# Patient Record
Sex: Female | Born: 1955
Health system: Southern US, Community
[De-identification: ages and names within clinical notes are randomized; demographics above are authoritative.]

## PROBLEM LIST (undated history)

## (undated) DIAGNOSIS — K529 Noninfective gastroenteritis and colitis, unspecified: Secondary | ICD-10-CM

## (undated) DIAGNOSIS — Z8669 Personal history of other diseases of the nervous system and sense organs: Secondary | ICD-10-CM

## (undated) DIAGNOSIS — J449 Chronic obstructive pulmonary disease, unspecified: Secondary | ICD-10-CM

## (undated) DIAGNOSIS — M8580 Other specified disorders of bone density and structure, unspecified site: Secondary | ICD-10-CM

## (undated) DIAGNOSIS — L680 Hirsutism: Secondary | ICD-10-CM

## (undated) DIAGNOSIS — F419 Anxiety disorder, unspecified: Secondary | ICD-10-CM

## (undated) DIAGNOSIS — Z8742 Personal history of other diseases of the female genital tract: Secondary | ICD-10-CM

## (undated) DIAGNOSIS — S42302A Unspecified fracture of shaft of humerus, left arm, initial encounter for closed fracture: Secondary | ICD-10-CM

## (undated) DIAGNOSIS — S82899A Other fracture of unspecified lower leg, initial encounter for closed fracture: Secondary | ICD-10-CM

## (undated) DIAGNOSIS — IMO0002 Reserved for concepts with insufficient information to code with codable children: Secondary | ICD-10-CM

## (undated) HISTORY — DX: Hirsutism: L68.0

## (undated) HISTORY — DX: Chronic obstructive pulmonary disease, unspecified: J44.9

## (undated) HISTORY — DX: Noninfective gastroenteritis and colitis, unspecified: K52.9

## (undated) HISTORY — DX: Reserved for concepts with insufficient information to code with codable children: IMO0002

## (undated) HISTORY — DX: Personal history of other diseases of the female genital tract: Z87.42

## (undated) HISTORY — PX: APPENDECTOMY: SHX54

## (undated) HISTORY — DX: Other specified disorders of bone density and structure, unspecified site: M85.80

## (undated) HISTORY — DX: Anxiety disorder, unspecified: F41.9

## (undated) HISTORY — DX: Other fracture of unspecified lower leg, initial encounter for closed fracture: S82.899A

## (undated) HISTORY — DX: Unspecified fracture of shaft of humerus, left arm, initial encounter for closed fracture: S42.302A

## (undated) HISTORY — DX: Personal history of other diseases of the nervous system and sense organs: Z86.69

---

## 1999-08-24 ENCOUNTER — Other Ambulatory Visit: Admission: RE | Admit: 1999-08-24 | Discharge: 1999-08-24 | Payer: Self-pay | Admitting: *Deleted

## 2000-08-25 ENCOUNTER — Other Ambulatory Visit: Admission: RE | Admit: 2000-08-25 | Discharge: 2000-08-25 | Payer: Self-pay | Admitting: *Deleted

## 2001-08-28 ENCOUNTER — Other Ambulatory Visit: Admission: RE | Admit: 2001-08-28 | Discharge: 2001-08-28 | Payer: Self-pay | Admitting: *Deleted

## 2002-09-03 ENCOUNTER — Other Ambulatory Visit: Admission: RE | Admit: 2002-09-03 | Discharge: 2002-09-03 | Payer: Self-pay | Admitting: *Deleted

## 2002-12-02 HISTORY — PX: PARTIAL COLECTOMY: SHX5273

## 2003-05-04 DIAGNOSIS — R87619 Unspecified abnormal cytological findings in specimens from cervix uteri: Secondary | ICD-10-CM

## 2003-05-04 DIAGNOSIS — IMO0002 Reserved for concepts with insufficient information to code with codable children: Secondary | ICD-10-CM

## 2003-05-04 HISTORY — DX: Reserved for concepts with insufficient information to code with codable children: IMO0002

## 2003-05-04 HISTORY — DX: Unspecified abnormal cytological findings in specimens from cervix uteri: R87.619

## 2003-06-14 ENCOUNTER — Encounter: Admission: RE | Admit: 2003-06-14 | Discharge: 2003-06-14 | Payer: Self-pay | Admitting: *Deleted

## 2003-12-02 ENCOUNTER — Other Ambulatory Visit: Admission: RE | Admit: 2003-12-02 | Discharge: 2003-12-02 | Payer: Self-pay | Admitting: *Deleted

## 2004-06-09 ENCOUNTER — Other Ambulatory Visit: Admission: RE | Admit: 2004-06-09 | Discharge: 2004-06-09 | Payer: Self-pay | Admitting: *Deleted

## 2004-12-02 ENCOUNTER — Other Ambulatory Visit: Admission: RE | Admit: 2004-12-02 | Discharge: 2004-12-02 | Payer: Self-pay | Admitting: *Deleted

## 2006-02-24 ENCOUNTER — Other Ambulatory Visit: Admission: RE | Admit: 2006-02-24 | Discharge: 2006-02-24 | Payer: Self-pay | Admitting: Obstetrics & Gynecology

## 2006-08-31 ENCOUNTER — Encounter: Admission: RE | Admit: 2006-08-31 | Discharge: 2006-08-31 | Payer: Self-pay | Admitting: Obstetrics & Gynecology

## 2007-03-23 ENCOUNTER — Other Ambulatory Visit: Admission: RE | Admit: 2007-03-23 | Discharge: 2007-03-23 | Payer: Self-pay | Admitting: Obstetrics & Gynecology

## 2008-03-22 ENCOUNTER — Encounter: Admission: RE | Admit: 2008-03-22 | Discharge: 2008-03-22 | Payer: Self-pay | Admitting: Obstetrics & Gynecology

## 2008-04-05 ENCOUNTER — Other Ambulatory Visit: Admission: RE | Admit: 2008-04-05 | Discharge: 2008-04-05 | Payer: Self-pay | Admitting: Obstetrics & Gynecology

## 2009-09-17 ENCOUNTER — Encounter: Admission: RE | Admit: 2009-09-17 | Discharge: 2009-10-24 | Payer: Self-pay | Admitting: Orthopedic Surgery

## 2009-12-09 ENCOUNTER — Encounter: Admission: RE | Admit: 2009-12-09 | Discharge: 2009-12-09 | Payer: Self-pay | Admitting: Orthopedic Surgery

## 2010-06-09 ENCOUNTER — Other Ambulatory Visit: Payer: Self-pay | Admitting: Obstetrics & Gynecology

## 2010-06-09 DIAGNOSIS — Z1231 Encounter for screening mammogram for malignant neoplasm of breast: Secondary | ICD-10-CM

## 2010-06-23 ENCOUNTER — Ambulatory Visit
Admission: RE | Admit: 2010-06-23 | Discharge: 2010-06-23 | Disposition: A | Discharge status: Home / Self Care | Payer: Self-pay | Source: Ambulatory Visit | Attending: Obstetrics & Gynecology | Admitting: Obstetrics & Gynecology

## 2010-06-23 DIAGNOSIS — Z1231 Encounter for screening mammogram for malignant neoplasm of breast: Secondary | ICD-10-CM

## 2011-09-23 ENCOUNTER — Other Ambulatory Visit: Payer: Self-pay | Admitting: Obstetrics & Gynecology

## 2011-09-23 DIAGNOSIS — Z1231 Encounter for screening mammogram for malignant neoplasm of breast: Secondary | ICD-10-CM

## 2011-10-12 ENCOUNTER — Ambulatory Visit
Admission: RE | Admit: 2011-10-12 | Discharge: 2011-10-12 | Disposition: A | Discharge status: Home / Self Care | Payer: 59 | Source: Ambulatory Visit | Attending: Obstetrics & Gynecology | Admitting: Obstetrics & Gynecology

## 2011-10-12 DIAGNOSIS — Z1231 Encounter for screening mammogram for malignant neoplasm of breast: Secondary | ICD-10-CM

## 2012-06-12 ENCOUNTER — Encounter: Payer: Self-pay | Admitting: Obstetrics & Gynecology

## 2012-07-13 ENCOUNTER — Other Ambulatory Visit: Payer: Self-pay | Admitting: Obstetrics & Gynecology

## 2012-07-13 ENCOUNTER — Other Ambulatory Visit: Payer: Self-pay

## 2012-07-13 DIAGNOSIS — Z1231 Encounter for screening mammogram for malignant neoplasm of breast: Secondary | ICD-10-CM

## 2012-07-19 ENCOUNTER — Telehealth: Payer: Self-pay | Admitting: Obstetrics & Gynecology

## 2012-07-19 NOTE — Telephone Encounter (Signed)
Pt only would like to see Dr Hyacinth Meeker for her aex...pls call pt to rs soon. thanks

## 2012-07-26 ENCOUNTER — Encounter: Payer: Self-pay | Admitting: Obstetrics & Gynecology

## 2012-07-26 ENCOUNTER — Ambulatory Visit: Payer: Self-pay | Admitting: Obstetrics & Gynecology

## 2012-07-26 ENCOUNTER — Ambulatory Visit (INDEPENDENT_AMBULATORY_CARE_PROVIDER_SITE_OTHER): Payer: 59 | Admitting: Obstetrics & Gynecology

## 2012-07-26 VITALS — BP 112/72 | Ht 64.25 in | Wt 112.0 lb

## 2012-07-26 DIAGNOSIS — L680 Hirsutism: Secondary | ICD-10-CM

## 2012-07-26 DIAGNOSIS — Z0000 Encounter for general adult medical examination without abnormal findings: Secondary | ICD-10-CM

## 2012-07-26 DIAGNOSIS — N289 Disorder of kidney and ureter, unspecified: Secondary | ICD-10-CM

## 2012-07-26 DIAGNOSIS — L689 Hypertrichosis, unspecified: Secondary | ICD-10-CM

## 2012-07-26 DIAGNOSIS — Z01419 Encounter for gynecological examination (general) (routine) without abnormal findings: Secondary | ICD-10-CM

## 2012-07-26 LAB — POCT URINALYSIS DIPSTICK
Bilirubin, UA: NEGATIVE
Glucose, UA: NEGATIVE
Leukocytes, UA: NEGATIVE
Nitrite, UA: NEGATIVE
Urobilinogen, UA: NEGATIVE
pH, UA: 5

## 2012-07-26 MED ORDER — EFLORNITHINE HCL 13.9 % EX CREA: 1.0000 "application " | TOPICAL_CREAM | Freq: Two times a day (BID) | CUTANEOUS | Status: DC

## 2012-07-26 NOTE — Patient Instructions (Signed)

## 2012-07-26 NOTE — Progress Notes (Signed)
57 y.o. G0P0 MarriedCaucasianF here for annual exam.  She had full physical exam with [facility].  Labs were all fine except GFR was 57.  Off Spironolactone and using Vaniqa.  Complaint of small red patch on abdomen.  Just moved mother in [place] into nursing home.  Was doing lots of sleeping on couches and going through clothes/closets.  No itching.  No LMP recorded. Patient is postmenopausal.          Sexually active: yes  The current method of family planning is post menopausal status.    Exercising: yes  walking and ice skatine Smoker:  no  Health Maintenance: Pap:  06/30/11 WNL/negative HR HPV MMG:  10/13/11 normal  Colonoscopy:  10/09 repeat 5 years--this year (mother had colon cancer) BMD:   12/09/09 -1.6 spine TDaP:  7/09   reports that she has never smoked. She does not have any smokeless tobacco history on file. She reports that  drinks alcohol. She reports that she does not use illicit drugs.  Past Medical History  Diagnosis Date  . Abnormal pap 2005    CIN 1- Colpo CIN1, Neg ECC    Past Surgical History  Procedure Laterality Date  . Partial colectomy Right 12/2002    with appendectomy    Current Outpatient Prescriptions  Medication Sig Dispense Refill  . aspirin 81 MG tablet 81 mg. Take two daily      . B Complex Vitamins (B COMPLEX PO) Take by mouth.      . Calcium Carb-Cholecalciferol (CALCIUM 1000 + D PO) Take by mouth.      . FIBER PO Take by mouth 2 (two) times daily.      Marland Kitchen L-Lysine 500 MG CAPS Take 1 capsule by mouth.        No current facility-administered medications for this visit.    Family History  Problem Relation Age of Onset  . Cancer - Ovarian Mother 74  . Cancer - Colon Mother 61    ROS:  Pertinent items are noted in HPI.  Otherwise, a comprehensive ROS was negative.  Exam:   BP 112/72  Ht 5' 4.25" (1.632 m)  Wt 112 lb (50.803 kg)  BMI 19.07 kg/m2  Height:   Height: 5' 4.25" (163.2 cm)  Ht Readings from Last 3 Encounters:  07/26/12 5' 4.25"  (1.632 m)    General appearance: alert, cooperative and appears stated age Head: Normocephalic, without obvious abnormality, atraumatic Neck: no adenopathy, supple, symmetrical, trachea midline and thyroid not enlarged, symmetric, no tenderness/mass/nodules Lungs: clear to auscultation bilaterally Breasts: Inspection negative, No nipple retraction or dimpling, No nipple discharge or bleeding, No axillary or supraclavicular adenopathy, Normal to palpation without dominant masses Heart: regular rate and rhythm Abdomen: soft, non-tender; bowel sounds normal; no masses,  no organomegaly Extremities: extremities normal, atraumatic, no cyanosis or edema Skin: Skin color, texture, turgor normal. No rashes.  5 x 2cm erythematous patch-not raised noted just inferiorly from umbilicus Lymph nodes: Cervical, supraclavicular, and axillary nodes normal. No abnormal inguinal nodes palpated Neurologic: Grossly normal   Pelvic: External genitalia:  no lesions              Urethra:  normal appearing urethra with no masses, tenderness or lesions              Bartholins and Skenes: normal                 Vagina: atrohpic appearing vagina with normal color and discharge, no lesions  Cervix: no lesions              Pap taken: yes Bimanual Exam:  Uterus:  normal size, contour, position, consistency, mobility, non-tender              Adnexa: normal adnexa               Rectovaginal: Confirms               Anus:  normal sphincter tone, no lesions  A:  Well Woman with normal exam, mild renal insufficiency possibly from spirinolactone use, vaginal atrophy, new erythematous patch on abdomen  P:   mammogram pap smear Return 3 months for BMP with GFR Pt will watch skin lesion--if worsens will call for derm referal return annually or prn  An After Visit Summary was printed and given to the patient.

## 2012-09-26 ENCOUNTER — Other Ambulatory Visit (INDEPENDENT_AMBULATORY_CARE_PROVIDER_SITE_OTHER): Payer: 59

## 2012-09-26 DIAGNOSIS — N289 Disorder of kidney and ureter, unspecified: Secondary | ICD-10-CM

## 2012-09-26 LAB — BASIC METABOLIC PANEL WITH GFR
BUN: 12 mg/dL (ref 6–23)
CO2: 28 mEq/L (ref 19–32)
Calcium: 9.5 mg/dL (ref 8.4–10.5)
Creat: 0.81 mg/dL (ref 0.50–1.10)
GFR, Est African American: >89 mL/min
Glucose, Bld: 88 mg/dL (ref 70–99)
Sodium: 140 mEq/L (ref 135–145)

## 2012-09-28 ENCOUNTER — Telehealth: Payer: Self-pay

## 2012-09-28 NOTE — Telephone Encounter (Signed)
Message copied by Elisha Headland on Thu Sep 28, 2012  5:05 PM ------      Message from: Jerene Bears      Created: Wed Sep 27, 2012  7:16 AM       Please inform patient BMP was normal.  GFR was normal as well.  She had an abnormal value earlier in the year with PCP. ------

## 2012-09-28 NOTE — Telephone Encounter (Signed)
5/29 lmtcb//kn

## 2012-09-29 NOTE — Telephone Encounter (Signed)
Patient notified of results.

## 2012-10-12 ENCOUNTER — Ambulatory Visit
Admission: RE | Admit: 2012-10-12 | Discharge: 2012-10-12 | Disposition: A | Discharge status: Home / Self Care | Payer: 59 | Source: Ambulatory Visit

## 2012-10-12 DIAGNOSIS — Z1231 Encounter for screening mammogram for malignant neoplasm of breast: Secondary | ICD-10-CM

## 2013-07-18 DIAGNOSIS — E282 Polycystic ovarian syndrome: Secondary | ICD-10-CM | POA: Insufficient documentation

## 2013-07-18 DIAGNOSIS — N399 Disorder of urinary system, unspecified: Secondary | ICD-10-CM | POA: Insufficient documentation

## 2013-07-18 DIAGNOSIS — N289 Disorder of kidney and ureter, unspecified: Secondary | ICD-10-CM | POA: Insufficient documentation

## 2013-07-18 DIAGNOSIS — Q211 Atrial septal defect: Secondary | ICD-10-CM | POA: Insufficient documentation

## 2013-07-18 DIAGNOSIS — Q2111 Secundum atrial septal defect: Secondary | ICD-10-CM | POA: Insufficient documentation

## 2013-07-18 HISTORY — DX: Polycystic ovarian syndrome: E28.2

## 2013-07-19 ENCOUNTER — Other Ambulatory Visit: Payer: Self-pay | Admitting: *Deleted

## 2013-07-19 DIAGNOSIS — L689 Hypertrichosis, unspecified: Secondary | ICD-10-CM

## 2013-07-19 MED ORDER — EFLORNITHINE HCL 13.9 % EX CREA: 1.0000 "application " | TOPICAL_CREAM | Freq: Two times a day (BID) | CUTANEOUS | Status: DC

## 2013-07-19 NOTE — Telephone Encounter (Signed)
Incoming fax requesting Vaniqa cream.  Last AEX and refill 07/26/2012 #30g/12 refills Next appt 10/16/2013   Will refill enough until next appt 10/2013.

## 2013-07-19 NOTE — Addendum Note (Signed)
Addended by: Dion BodyBELTRAN, Ripley Lovecchio C on: 07/19/2013 01:50 PM   Modules accepted: Orders

## 2013-08-01 DIAGNOSIS — J449 Chronic obstructive pulmonary disease, unspecified: Secondary | ICD-10-CM

## 2013-08-01 HISTORY — DX: Chronic obstructive pulmonary disease, unspecified: J44.9

## 2013-09-27 DIAGNOSIS — J449 Chronic obstructive pulmonary disease, unspecified: Secondary | ICD-10-CM | POA: Insufficient documentation

## 2013-10-16 ENCOUNTER — Encounter: Payer: Self-pay | Admitting: Obstetrics & Gynecology

## 2013-10-16 ENCOUNTER — Ambulatory Visit (INDEPENDENT_AMBULATORY_CARE_PROVIDER_SITE_OTHER): Payer: 59 | Admitting: Obstetrics & Gynecology

## 2013-10-16 VITALS — BP 104/68 | HR 60 | Resp 16 | Ht 63.75 in | Wt 116.8 lb

## 2013-10-16 DIAGNOSIS — L680 Hirsutism: Secondary | ICD-10-CM

## 2013-10-16 DIAGNOSIS — L689 Hypertrichosis, unspecified: Secondary | ICD-10-CM

## 2013-10-16 DIAGNOSIS — Z01419 Encounter for gynecological examination (general) (routine) without abnormal findings: Secondary | ICD-10-CM

## 2013-10-16 MED ORDER — EFLORNITHINE HCL 13.9 % EX CREA: 1.0000 "application " | TOPICAL_CREAM | Freq: Two times a day (BID) | CUTANEOUS | Status: DC

## 2013-10-16 NOTE — Progress Notes (Addendum)
58 y.o. G0P0000 MarriedCaucasianF here for annual exam.  Has physical exam three months ago.  Labs including renal function was all fine.  No vaginal bleeding.     Mother died earlier this year.  Had long term dementia.  Has hip and then femur fracture.    Patient's last menstrual period was 05/03/2008.          Sexually active: yes-some The current method of family planning is post menopausal status.    Exercising: yes  skating and walking Smoker:  no  Health Maintenance: Pap:  07/26/12 WNL History of abnormal Pap:  yes MMG:  10/12/12-normal Colonoscopy:  11/14-repeat in 5 years BMD:   12/09/09-mild osteopenia TDaP:  7/09 Screening Labs: PCP, Hb today: PCP, Urine today: PCP   reports that she has never smoked. She has never used smokeless tobacco. She reports that she drinks about .5 ounces of alcohol per week. She reports that she does not use illicit drugs.  Past Medical History  Diagnosis Date  . Abnormal pap 2005    CIN 1- Colpo CIN1, Neg ECC  . History of PCOS   . Hx of migraines   . Anxiety   . Ankle fracture   . Colitis   . Hirsutism   . COPD (chronic obstructive pulmonary disease) 4/15    had spirometry     Past Surgical History  Procedure Laterality Date  . Partial colectomy Right 12/2002    with appendectomy  . Appendectomy      Current Outpatient Prescriptions  Medication Sig Dispense Refill  . aspirin 81 MG tablet 81 mg. Take one daily      . B Complex Vitamins (B COMPLEX PO) Take by mouth.      . Calcium Carb-Cholecalciferol (CALCIUM 1000 + D PO) Take by mouth.      . Eflornithine HCl (VANIQA) 13.9 % cream Apply 1 application topically 2 (two) times daily at 10 AM and 5 PM.  30 g  2  . escitalopram (LEXAPRO) 5 MG tablet 5 mg daily.      Marland Kitchen. FIBER PO Take by mouth 2 (two) times daily.      Marland Kitchen. L-Lysine 500 MG CAPS Take 1 capsule by mouth.        No current facility-administered medications for this visit.    Family History  Problem Relation Age of Onset  .  Cancer - Ovarian Mother 5541  . Cancer - Colon Mother 9083  . Hypertension Father   . Diabetes Paternal Grandfather   . Heart disease Maternal Grandfather     ROS:  Pertinent items are noted in HPI.  Otherwise, a comprehensive ROS was negative.  Exam:   BP 104/68  Pulse 60  Resp 16  Ht 5' 3.75" (1.619 m)  Wt 116 lb 12.8 oz (52.98 kg)  BMI 20.21 kg/m2  LMP 05/03/2008  Weight change:+4#   Height: 5' 3.75" (161.9 cm)  Ht Readings from Last 3 Encounters:  10/16/13 5' 3.75" (1.619 m)  07/26/12 5' 4.25" (1.632 m)    General appearance: alert, cooperative and appears stated age Head: Normocephalic, without obvious abnormality, atraumatic Neck: no adenopathy, supple, symmetrical, trachea midline and thyroid normal to inspection and palpation Lungs: clear to auscultation bilaterally Breasts: normal appearance, no masses or tenderness Heart: regular rate and rhythm Abdomen: soft, non-tender; bowel sounds normal; no masses,  no organomegaly Extremities: extremities normal, atraumatic, no cyanosis or edema Skin: Skin color, texture, turgor normal. No rashes or lesions Lymph nodes: Cervical, supraclavicular, and axillary  nodes normal. No abnormal inguinal nodes palpated Neurologic: Grossly normal   Pelvic: External genitalia:  no lesions              Urethra:  normal appearing urethra with no masses, tenderness or lesions              Bartholins and Skenes: normal                 Vagina: normal appearing vagina with normal color and discharge, no lesions              Cervix: no lesions              Pap taken: no Bimanual Exam:  Uterus:  normal size, contour, position, consistency, mobility, non-tender              Adnexa: normal adnexa and no mass, fullness, tenderness               Rectovaginal: Confirms               Anus:  normal sphincter tone, no lesions  A:  Well Woman with normal exam Vaginal atrophic changes Mild renal insufficiency with spironolactone use.  Off  now. Depression.  Now on Lexapro.   Grief reaction to mother's death.  Coping appropriately.  Vaniqu rx to pharmacy.  P:   Mammogram yearly.  D/W pt 3D due to dense breasts. pap smear last year.  Neg pap with neg HR HPV 2/13. Labs with PCP's office return annually or prn  An After Visit Summary was printed and given to the patient.

## 2013-10-16 NOTE — Addendum Note (Signed)
Addended by: Jerene BearsMILLER, MARY S on: 10/16/2013 02:35 PM   Modules accepted: Orders

## 2013-10-16 NOTE — Patient Instructions (Signed)

## 2013-12-01 DIAGNOSIS — N289 Disorder of kidney and ureter, unspecified: Secondary | ICD-10-CM | POA: Insufficient documentation

## 2013-12-01 DIAGNOSIS — B009 Herpesviral infection, unspecified: Secondary | ICD-10-CM | POA: Insufficient documentation

## 2013-12-01 DIAGNOSIS — G902 Horner's syndrome: Secondary | ICD-10-CM | POA: Insufficient documentation

## 2013-12-01 DIAGNOSIS — Q2112 Patent foramen ovale: Secondary | ICD-10-CM | POA: Insufficient documentation

## 2013-12-01 DIAGNOSIS — Q211 Atrial septal defect: Secondary | ICD-10-CM | POA: Insufficient documentation

## 2013-12-24 ENCOUNTER — Other Ambulatory Visit: Payer: Self-pay

## 2013-12-24 DIAGNOSIS — Z1231 Encounter for screening mammogram for malignant neoplasm of breast: Secondary | ICD-10-CM

## 2014-01-09 ENCOUNTER — Ambulatory Visit
Admission: RE | Admit: 2014-01-09 | Discharge: 2014-01-09 | Disposition: A | Discharge status: Home / Self Care | Payer: 59 | Source: Ambulatory Visit

## 2014-01-09 DIAGNOSIS — Z1231 Encounter for screening mammogram for malignant neoplasm of breast: Secondary | ICD-10-CM

## 2014-10-29 ENCOUNTER — Encounter: Payer: Self-pay | Admitting: Obstetrics & Gynecology

## 2014-10-29 ENCOUNTER — Other Ambulatory Visit: Payer: Self-pay | Admitting: Obstetrics & Gynecology

## 2014-10-29 ENCOUNTER — Ambulatory Visit (INDEPENDENT_AMBULATORY_CARE_PROVIDER_SITE_OTHER): Payer: BLUE CROSS/BLUE SHIELD | Admitting: Obstetrics & Gynecology

## 2014-10-29 VITALS — BP 138/80 | HR 60 | Resp 16 | Ht 64.0 in | Wt 121.0 lb

## 2014-10-29 DIAGNOSIS — Z124 Encounter for screening for malignant neoplasm of cervix: Secondary | ICD-10-CM

## 2014-10-29 DIAGNOSIS — L689 Hypertrichosis, unspecified: Secondary | ICD-10-CM

## 2014-10-29 DIAGNOSIS — Z01419 Encounter for gynecological examination (general) (routine) without abnormal findings: Secondary | ICD-10-CM | POA: Diagnosis not present

## 2014-10-29 NOTE — Telephone Encounter (Signed)
Medication refill request: Vaniqa cream  Last AEX:  10/29/14 SM Next AEX: 01/29/16 SM Last MMG (if hormonal medication request): 01/09/14 BIRADS1:Neg Refill authorized: 10/16/13 #30g/6R. Today please advise.

## 2014-10-29 NOTE — Patient Instructions (Signed)
[facility]  [address], [place], [place] [address], [place]  Phone: [phone] Dr. Gloris ManchesterSteinhelfer  [facility] [address], [place], [place] [address]

## 2014-10-29 NOTE — Telephone Encounter (Signed)
Patient is requesting a refill of Vaniqua. Confirmed pharmacy on file.

## 2014-10-29 NOTE — Progress Notes (Signed)
Patient ID: ANARIA KRONER, female   DOB: 20-Oct-1955, 59 y.o.   MRN: [id number]   59 y.o. G0P0000 MarriedCaucasianF here for annual exam.  Doing well. No vaginal bleeding.  Would like names of dermatologists as she has a mole she would like checked.  No vaginal bleeding.  PCP:  Dr. Derrell Lolling, [facility].  Last labs were done in the last year.    Patient's last menstrual period was 05/03/2008.          Sexually active: No.  The current method of family planning is none.    Exercising: Yes.    ice skating  Smoker:  no  Health Maintenance: Pap:  07-26-12 WNL  History of abnormal Pap:  Yes- patient said it was many years ago and no procedures were done MMG:  01-09-14 WNL Colonoscopy:  03-2013 repeat in 5 years BMD:   12-09-09 Mild osteopenia TDaP:  11-2007 Screening Labs: PCP does labs   reports that she has never smoked. She has never used smokeless tobacco. She reports that she drinks about 2.4 - 3.0 oz of alcohol per week. She reports that she does not use illicit drugs.  Past Medical History  Diagnosis Date  . Abnormal pap 2005    CIN 1- Colpo CIN1, Neg ECC  . History of PCOS   . Hx of migraines   . Anxiety   . Ankle fracture   . Colitis   . Hirsutism   . COPD (chronic obstructive pulmonary disease) 4/15    had spirometry   . Osteopenia     Past Surgical History  Procedure Laterality Date  . Partial colectomy Right 12/2002    with appendectomy  . Appendectomy      Current Outpatient Prescriptions  Medication Sig Dispense Refill  . aspirin 81 MG tablet 81 mg. Take one daily    . Calcium Carb-Cholecalciferol (CALCIUM 1000 + D PO) Take by mouth.    . Eflornithine HCl (VANIQA) 13.9 % cream Apply 1 application topically 2 (two) times daily at 10 AM and 5 PM. 30 g 6  . FIBER PO Take by mouth 2 (two) times daily.     No current facility-administered medications for this visit.    Family History  Problem Relation Age of Onset  . Cancer - Ovarian Mother 20  . Cancer -  Colon Mother 15  . Hypertension Father   . Diabetes Paternal Grandfather   . Heart disease Maternal Grandfather     ROS:  Pertinent items are noted in HPI.  Otherwise, a comprehensive ROS was negative.  Exam:   BP 138/80 mmHg  Pulse 60  Resp 16  Ht  (1.626 m)  Wt 121 lb (54.885 kg)  BMI 20.76 kg/m2  LMP 05/03/2008   Height:  (162.6 cm)  Ht Readings from Last 3 Encounters:  10/29/14  (1.626 m)  10/16/13 5' 3.75" (1.619 m)  07/26/12 5' 4.25" (1.632 m)    General appearance: alert, cooperative and appears stated age Head: Normocephalic, without obvious abnormality, atraumatic Neck: no adenopathy, supple, symmetrical, trachea midline and thyroid normal to inspection and palpation Lungs: clear to auscultation bilaterally Breasts: normal appearance, no masses or tenderness Heart: regular rate and rhythm Abdomen: soft, non-tender; bowel sounds normal; no masses,  no organomegaly Extremities: extremities normal, atraumatic, no cyanosis or edema Skin: Skin color, texture, turgor normal. No rashes or lesions Lymph nodes: Cervical, supraclavicular, and axillary nodes normal. No abnormal inguinal nodes palpated Neurologic: Grossly normal   Pelvic:  External genitalia:  no lesions              Urethra:  normal appearing urethra with no masses, tenderness or lesions              Bartholins and Skenes: normal                 Vagina: normal appearing vagina with normal color and discharge, no lesions              Cervix: no lesions              Pap taken: Yes.   Bimanual Exam:  Uterus:  normal size, contour, position, consistency, mobility, non-tender              Adnexa: normal adnexa and no mass, fullness, tenderness               Rectovaginal: Confirms               Anus:  normal sphincter tone, no lesions  Chaperone was present for exam.  A:  Well Woman with normal exax Mild renal insufficiency possibly from spirinolactone use Vaginal atrophy Family hx of colon  cancer/adenomatous polyps (mother and sister)  P: Mammogram yearly pap smear obtained today Names for dermatologist given Labs with PCP Colonoscopy recommended for every five years--2019 will be the next one. return annually or prn

## 2014-10-30 MED ORDER — EFLORNITHINE HCL 13.9 % EX CREA: 1.0000 "application " | TOPICAL_CREAM | Freq: Two times a day (BID) | CUTANEOUS | Status: DC

## 2014-10-31 LAB — IPS PAP TEST WITH REFLEX TO HPV

## 2016-01-29 ENCOUNTER — Ambulatory Visit (INDEPENDENT_AMBULATORY_CARE_PROVIDER_SITE_OTHER): Payer: BLUE CROSS/BLUE SHIELD | Admitting: Obstetrics & Gynecology

## 2016-01-29 ENCOUNTER — Encounter: Payer: Self-pay | Admitting: Obstetrics & Gynecology

## 2016-01-29 VITALS — BP 120/80 | HR 70 | Resp 16 | Ht 63.75 in | Wt 119.0 lb

## 2016-01-29 DIAGNOSIS — Z01419 Encounter for gynecological examination (general) (routine) without abnormal findings: Secondary | ICD-10-CM

## 2016-01-29 DIAGNOSIS — Z124 Encounter for screening for malignant neoplasm of cervix: Secondary | ICD-10-CM | POA: Diagnosis not present

## 2016-01-29 DIAGNOSIS — Z0000 Encounter for general adult medical examination without abnormal findings: Secondary | ICD-10-CM | POA: Diagnosis not present

## 2016-01-29 DIAGNOSIS — N39498 Other specified urinary incontinence: Secondary | ICD-10-CM | POA: Diagnosis not present

## 2016-01-29 DIAGNOSIS — L689 Hypertrichosis, unspecified: Secondary | ICD-10-CM

## 2016-01-29 LAB — POCT URINALYSIS DIPSTICK
Bilirubin, UA: NEGATIVE
GLUCOSE UA: NEGATIVE
Ketones, UA: NEGATIVE
Leukocytes, UA: NEGATIVE
NITRITE UA: NEGATIVE
PROTEIN UA: NEGATIVE
UROBILINOGEN UA: NEGATIVE
pH, UA: 5

## 2016-01-29 LAB — COMPREHENSIVE METABOLIC PANEL
ALK PHOS: 80 U/L (ref 33–130)
ALT: 18 U/L (ref 6–29)
AST: 22 U/L (ref 10–35)
Albumin: 4.5 g/dL (ref 3.6–5.1)
BILIRUBIN TOTAL: 0.8 mg/dL (ref 0.2–1.2)
BUN: 16 mg/dL (ref 7–25)
CO2: 27 mmol/L (ref 20–31)
CREATININE: 0.8 mg/dL (ref 0.50–1.05)
Calcium: 9.8 mg/dL (ref 8.6–10.4)
Chloride: 103 mmol/L (ref 98–110)
GLUCOSE: 80 mg/dL (ref 65–99)
Potassium: 4.4 mmol/L (ref 3.5–5.3)
SODIUM: 142 mmol/L (ref 135–146)
Total Protein: 7.1 g/dL (ref 6.1–8.1)

## 2016-01-29 LAB — CBC
HCT: 42.7 % (ref 35.0–45.0)
HEMOGLOBIN: 14.5 g/dL (ref 11.7–15.5)
MCH: 30.9 pg (ref 27.0–33.0)
MCHC: 34 g/dL (ref 32.0–36.0)
MCV: 90.9 fL (ref 80.0–100.0)
MPV: 9 fL (ref 7.5–12.5)
Platelets: 320 10*3/uL (ref 140–400)
RBC: 4.7 MIL/uL (ref 3.80–5.10)
RDW: 13 % (ref 11.0–15.0)
WBC: 7.3 10*3/uL (ref 3.8–10.8)

## 2016-01-29 LAB — TSH: TSH: 1.67 m[IU]/L

## 2016-01-29 LAB — LIPID PANEL
Cholesterol: 192 mg/dL (ref 125–200)
HDL: 67 mg/dL (ref >=46)
LDL CALC: 108 mg/dL (ref <130)
Total CHOL/HDL Ratio: 2.9 Ratio (ref <=5.0)
Triglycerides: 83 mg/dL (ref <150)
VLDL: 17 mg/dL (ref <30)

## 2016-01-29 MED ORDER — EFLORNITHINE HCL 13.9 % EX CREA: 1.0000 "application " | TOPICAL_CREAM | Freq: Two times a day (BID) | CUTANEOUS | 12 refills | Status: DC

## 2016-01-29 NOTE — Progress Notes (Signed)
60 y.o. G0P0000 MarriedCaucasianF here for annual exam.  Doing well.  No vaginal bleeding.  Had colon blockage last year and was hospitalized for this.  Relived with bowel rest.    She's had three episodes of urine leakage that have occurred without any prior symptoms--one exercising, one while walking into her bedroom, and one at night.  The last one woke her due to being wet.  She reports no symptoms of stress in continence.    PCP:  Mary FlashLucy Barden, PA, in [facility]illo's office  Patient's last menstrual period was 05/03/2008.          Sexually active: Yes.    The current method of family planning is post menopausal status.    Exercising: Yes.    work out, figure skating Smoker:  no  Health Maintenance: Pap:  10/29/14 Neg, 2/13 neg HR HPV History of abnormal Pap:  yes MMG:  01/09/14 BIRADS1:neg.  Aware it is Colonoscopy:  7/16 with endoscopy during hospitalization July 2016.  BMD:   12/11/09 Osteopenia TDaP:  2009 Pneumonia vaccine(s):  Done with PCP Zostavax:   No Hep C testing: done: neg.  Pt cannot remember where but knows she's had this done Screening Labs: here, Hb today: 13.7, Urine today: RBC=Small    reports that she has never smoked. She has never used smokeless tobacco. She reports that she drinks about 2.4 - 3.0 oz of alcohol per week . She reports that she does not use drugs.   Past Medical History:  Diagnosis Date  . Abnormal pap 2005   CIN 1- Colpo CIN1, Neg ECC  . Ankle fracture   . Anxiety   . Colitis   . COPD (chronic obstructive pulmonary disease) (HCC) 4/15   had spirometry   . Hirsutism   . History of PCOS   . Hx of migraines   . Osteopenia     Past Surgical History:  Procedure Laterality Date  . APPENDECTOMY    . PARTIAL COLECTOMY Right 12/2002   with appendectomy    Current Outpatient Prescriptions  Medication Sig Dispense Refill  . aspirin 81 MG tablet 81 mg. Take one daily    . B Complex Vitamins (VITAMIN-B COMPLEX) TABS Take by mouth.    . Calcium  Carb-Cholecalciferol (CALCIUM 1000 + D PO) Take by mouth.    . Eflornithine HCl (VANIQA) 13.9 % cream Apply 1 application topically 2 (two) times daily at 10 AM and 5 PM. 30 g 6  . FIBER PO Take by mouth 2 (two) times daily.    Marland Kitchen. L-Lysine 500 MG TABS Take by mouth.    . Multiple Vitamins-Minerals (HAIR/SKIN/NAILS/BIOTIN) TABS Take by mouth daily.     No current facility-administered medications for this visit.     Family History  Problem Relation Age of Onset  . Cancer - Ovarian Mother 5841  . Cancer - Colon Mother 7983  . Hypertension Father   . Diabetes Paternal Grandfather   . Heart disease Maternal Grandfather     ROS:  Pertinent items are noted in HPI.  Otherwise, a comprehensive ROS was negative.  Exam:   BP 120/80 (BP Location: Right Arm, Patient Position: Sitting, Cuff Size: Normal)   Pulse 70   Resp 16   Ht 5' 3.75" (1.619 m)   Wt 119 lb (54 kg)   LMP 05/03/2008   BMI 20.59 kg/m   Weight change: -2#  Height: 5' 3.75" (161.9 cm)  Ht Readings from Last 3 Encounters:  01/29/16 5' 3.75" (1.619  m)  10/29/14 5\' 4"  (1.626 m)  10/16/13 5' 3.75" (1.619 m)    General appearance: alert, cooperative and appears stated age Head: Normocephalic, without obvious abnormality, atraumatic Neck: no adenopathy, supple, symmetrical, trachea midline and thyroid normal to inspection and palpation Lungs: clear to auscultation bilaterally Breasts: normal appearance, no masses or tenderness Heart: regular rate and rhythm Abdomen: soft, non-tender; bowel sounds normal; no masses,  no organomegaly Extremities: extremities normal, atraumatic, no cyanosis or edema Skin: Skin color, texture, turgor normal. No rashes or lesions Lymph nodes: Cervical, supraclavicular, and axillary nodes normal. No abnormal inguinal nodes palpated Neurologic: Grossly normal  Pelvic: External genitalia:  no lesions              Urethra:  normal appearing urethra with no masses, tenderness or lesions               Bartholins and Skenes: normal                 Vagina: normal appearing vagina with normal color and discharge, no lesions, atrophic changes               Cervix: no lesions              Pap taken: Yes.   Bimanual Exam:  Uterus:  normal size, contour, position, consistency, mobility, non-tender              Adnexa: normal adnexa and no mass, fullness, tenderness               Rectovaginal: Confirms               Anus:  normal sphincter tone, no lesions  Chaperone was present for exam.  A:  Well Woman with normal exax Mild renal insufficiency possibly from spirinolactone use Vaginal atrophy Family hx of colon cancer/adenomatous polyps (mother and sister).  Colonoscopy 7/16 done after blockage. Three episodes of spontaneous urinary incontinence  P: Mammogram yearly pap smear obtained today CBC, CMP, Lipids, TSH, Vit D, Colonoscopy recommended for every five years.  Last one July, 2017. Recommended urology referral.  Pt would like to monitor this further before going for a referral. Urine culture. return annually or prn

## 2016-01-30 LAB — URINE CULTURE: Organism ID, Bacteria: NO GROWTH

## 2016-01-30 LAB — VITAMIN D 25 HYDROXY (VIT D DEFICIENCY, FRACTURES): Vit D, 25-Hydroxy: 41 ng/mL (ref 30–100)

## 2016-02-02 LAB — IPS PAP TEST WITH HPV

## 2016-02-03 LAB — HEMOGLOBIN, FINGERSTICK: HEMOGLOBIN, FINGERSTICK: 13.7 g/dL (ref 12.0–16.0)

## 2017-02-12 ENCOUNTER — Other Ambulatory Visit: Payer: Self-pay | Admitting: Obstetrics & Gynecology

## 2017-02-12 DIAGNOSIS — L689 Hypertrichosis, unspecified: Secondary | ICD-10-CM

## 2017-02-14 NOTE — Telephone Encounter (Signed)
Medication refill request: Vaniqa Last AEX:  01-29-16  Next AEX: 05-13-17  Last MMG (if hormonal medication request): 01-09-14 WNL  Refill authorized: please advise

## 2017-04-02 HISTORY — PX: CATARACT EXTRACTION, BILATERAL: SHX1313

## 2017-05-03 DIAGNOSIS — S42302A Unspecified fracture of shaft of humerus, left arm, initial encounter for closed fracture: Secondary | ICD-10-CM

## 2017-05-03 HISTORY — DX: Unspecified fracture of shaft of humerus, left arm, initial encounter for closed fracture: S42.302A

## 2017-05-13 ENCOUNTER — Other Ambulatory Visit: Payer: Self-pay

## 2017-05-13 ENCOUNTER — Other Ambulatory Visit (HOSPITAL_COMMUNITY)
Admission: RE | Admit: 2017-05-13 | Discharge: 2017-05-13 | Disposition: A | Discharge status: Home / Self Care | Payer: Commercial Managed Care - PPO | Source: Ambulatory Visit | Attending: Obstetrics & Gynecology | Admitting: Obstetrics & Gynecology

## 2017-05-13 ENCOUNTER — Encounter: Payer: Self-pay | Admitting: Obstetrics & Gynecology

## 2017-05-13 ENCOUNTER — Other Ambulatory Visit: Payer: Self-pay | Admitting: Obstetrics & Gynecology

## 2017-05-13 ENCOUNTER — Ambulatory Visit (INDEPENDENT_AMBULATORY_CARE_PROVIDER_SITE_OTHER): Payer: Commercial Managed Care - PPO | Admitting: Obstetrics & Gynecology

## 2017-05-13 ENCOUNTER — Ambulatory Visit
Admission: RE | Admit: 2017-05-13 | Discharge: 2017-05-13 | Disposition: A | Discharge status: Home / Self Care | Payer: Commercial Managed Care - PPO | Source: Ambulatory Visit | Attending: Obstetrics & Gynecology | Admitting: Obstetrics & Gynecology

## 2017-05-13 VITALS — BP 120/60 | HR 68 | Resp 16 | Ht 63.75 in | Wt 114.0 lb

## 2017-05-13 DIAGNOSIS — Z1231 Encounter for screening mammogram for malignant neoplasm of breast: Secondary | ICD-10-CM

## 2017-05-13 DIAGNOSIS — Z124 Encounter for screening for malignant neoplasm of cervix: Secondary | ICD-10-CM | POA: Insufficient documentation

## 2017-05-13 DIAGNOSIS — Z01419 Encounter for gynecological examination (general) (routine) without abnormal findings: Secondary | ICD-10-CM | POA: Diagnosis not present

## 2017-05-13 NOTE — Progress Notes (Deleted)
62 y.o. G0P0000 MarriedCaucasianF here for annual exam.    Patient's last menstrual period was 05/03/2008.          Sexually active: No.  The current method of family planning is post menopausal status.  Exercising: Yes.    figure skating  Smoker:  no  Health Maintenance: Pap:  01/29/16 Neg. HR HPV:neg   10/29/14 Neg  History of abnormal Pap:  Yes, remote hx MMG:  01/09/14 BIRADS1:Neg  Colonoscopy:  11/2014 f/u 5 years.  BMD:   2011 Osteopenia  TDaP: 05/12/17  Pneumonia vaccine(s):  Done Shingrix:   No Hep C testing: Done  Screening Labs: PCP   reports that  has never smoked. she has never used smokeless tobacco. She reports that she drinks about 2.4 - 3.0 oz of alcohol per week. She reports that she does not use drugs.  Past Medical History:  Diagnosis Date  . Abnormal pap 2005   CIN 1- Colpo CIN1, Neg ECC  . Ankle fracture   . Anxiety   . Colitis   . COPD (chronic obstructive pulmonary disease) (HCC) 4/15   had spirometry   . Hirsutism   . History of PCOS   . Hx of migraines   . Osteopenia     Past Surgical History:  Procedure Laterality Date  . APPENDECTOMY    . CATARACT EXTRACTION, BILATERAL  04/2017  . PARTIAL COLECTOMY Right 12/2002   with appendectomy    Current Outpatient Medications  Medication Sig Dispense Refill  . aspirin 81 MG tablet 81 mg. Take one daily    . B Complex Vitamins (VITAMIN-B COMPLEX) TABS Take by mouth.    . Calcium Carb-Cholecalciferol (CALCIUM 1000 + D PO) Take by mouth.    . FIBER PO Take by mouth 2 (two) times daily.    Marland Kitchen L-Lysine 500 MG TABS Take by mouth.    . Multiple Vitamins-Minerals (HAIR/SKIN/NAILS/BIOTIN) TABS Take by mouth daily.    . Multiple Vitamins-Minerals (PRESERVISION AREDS PO) Take by mouth daily.    Marland Kitchen terbinafine (LAMISIL) 1 % cream Apply topically 1-2x/day    . TURMERIC PO Take by mouth daily.    Marland Kitchen PROLENSA 0.07 % SOLN Place 1 drop into the right eye at bedtime.  1   No current facility-administered medications for  this visit.     Family History  Problem Relation Age of Onset  . Cancer - Ovarian Mother 64  . Cancer - Colon Mother 66  . Hypertension Father   . Diabetes Paternal Grandfather   . Heart disease Maternal Grandfather   . Colonic polyp Sister        adenosarcoma in the polyp, did not have colon resection  . Colon cancer Cousin 87       paternal cousin    ROS:  Pertinent items are noted in HPI.  Otherwise, a comprehensive ROS was negative.  Exam:   BP 120/60 (BP Location: Right Arm, Patient Position: Sitting, Cuff Size: Normal)   Pulse 68   Resp 16   Ht 5' 3.75" (1.619 m)   Wt 114 lb (51.7 kg)   LMP 05/03/2008   BMI 19.72 kg/m   Weight change: @WEIGHTCHANGE @ Height:   Height: 5' 3.75" (161.9 cm)  Ht Readings from Last 3 Encounters:  05/13/17 5' 3.75" (1.619 m)  01/29/16 5' 3.75" (1.619 m)  10/29/14 5\' 4"  (1.626 m)    General appearance: alert, cooperative and appears stated age Head: Normocephalic, without obvious abnormality, atraumatic Neck: no adenopathy, supple, symmetrical, trachea  midline and thyroid {EXAM; THYROID:18604} Lungs: clear to auscultation bilaterally Breasts: {Exam; breast:13139::"normal appearance, no masses or tenderness"} Heart: regular rate and rhythm Abdomen: soft, non-tender; bowel sounds normal; no masses,  no organomegaly Extremities: extremities normal, atraumatic, no cyanosis or edema Skin: Skin color, texture, turgor normal. No rashes or lesions Lymph nodes: Cervical, supraclavicular, and axillary nodes normal. No abnormal inguinal nodes palpated Neurologic: Grossly normal   Pelvic: External genitalia:  no lesions              Urethra:  normal appearing urethra with no masses, tenderness or lesions              Bartholins and Skenes: normal                 Vagina: normal appearing vagina with normal color and discharge, no lesions              Cervix: {exam; cervix:14595}              Pap taken: {yes no:314532} Bimanual Exam:  Uterus:   {exam; uterus:12215}              Adnexa: {exam; adnexa:12223}               Rectovaginal: Confirms               Anus:  normal sphincter tone, no lesions  Chaperone was present for exam.  A:  Well Woman with normal exam  P:   {plan; gyn:5269::"mammogram","pap smear","return annually or prn"}

## 2017-05-13 NOTE — Progress Notes (Signed)
62 y.o. G0P0000 MarriedCaucasianF here for annual exam.  Just had new grand baby to her daughter in law--[place].  They live in [place].    Denies vaginal bleeding.    Had reaction to Vaniqa.  Stopped spironolactone due to renal function.  Facial hair has returned.  Wants to discuss options.  As hair is light, electrolysis is likely best option.  Will see if her dermatology office does this.  Patient's last menstrual period was 05/03/2008.          Sexually active: No.  The current method of family planning is post menopausal status.  Exercising: Yes.    figure skating  Smoker:  no  Health Maintenance: Pap:  01/29/16 Neg. HR HPV:neg   10/29/14 Neg  History of abnormal Pap:  Yes, remote hx MMG:  01/09/14 BIRADS1:Neg  Colonoscopy:  11/2014 f/u 5 years.  BMD:   2011 Osteopenia  TDaP: 05/12/17  Pneumonia vaccine(s):  Done Shingrix:   No Hep C testing: Done  Screening Labs: PCP   reports that  has never smoked. she has never used smokeless tobacco. She reports that she drinks about 2.4 - 3.0 oz of alcohol per week. She reports that she does not use drugs.  Past Medical History:  Diagnosis Date  . Abnormal pap 2005   CIN 1- Colpo CIN1, Neg ECC  . Ankle fracture   . Anxiety   . Colitis   . COPD (chronic obstructive pulmonary disease) (HCC) 4/15   had spirometry   . Hirsutism   . History of PCOS   . Hx of migraines   . Osteopenia     Past Surgical History:  Procedure Laterality Date  . APPENDECTOMY    . CATARACT EXTRACTION, BILATERAL  04/2017  . PARTIAL COLECTOMY Right 12/2002   with appendectomy    Current Outpatient Medications  Medication Sig Dispense Refill  . aspirin 81 MG tablet 81 mg. Take one daily    . B Complex Vitamins (VITAMIN-B COMPLEX) TABS Take by mouth.    . Calcium Carb-Cholecalciferol (CALCIUM 1000 + D PO) Take by mouth.    . FIBER PO Take by mouth 2 (two) times daily.    Marland Kitchen L-Lysine 500 MG TABS Take by mouth.    . Multiple Vitamins-Minerals  (HAIR/SKIN/NAILS/BIOTIN) TABS Take by mouth daily.    . Multiple Vitamins-Minerals (PRESERVISION AREDS PO) Take by mouth daily.    Marland Kitchen terbinafine (LAMISIL) 1 % cream Apply topically 1-2x/day    . TURMERIC PO Take by mouth daily.    Marland Kitchen PROLENSA 0.07 % SOLN Place 1 drop into the right eye at bedtime.  1   No current facility-administered medications for this visit.     Family History  Problem Relation Age of Onset  . Cancer - Ovarian Mother 45  . Cancer - Colon Mother 64  . Hypertension Father   . Diabetes Paternal Grandfather   . Heart disease Maternal Grandfather   . Colonic polyp Sister        adenosarcoma in the polyp, did not have colon resection  . Colon cancer Cousin 29       paternal cousin    ROS:  Pertinent items are noted in HPI.  Otherwise, a comprehensive ROS was negative.  Exam:   BP 120/60 (BP Location: Right Arm, Patient Position: Sitting, Cuff Size: Normal)   Pulse 68   Resp 16   Ht 5' 3.75" (1.619 m)   Wt 114 lb (51.7 kg)   LMP 05/03/2008   BMI  19.72 kg/m    Height: 5' 3.75" (161.9 cm)  Ht Readings from Last 3 Encounters:  05/13/17 5' 3.75" (1.619 m)  01/29/16 5' 3.75" (1.619 m)  10/29/14 5\' 4"  (1.626 m)    General appearance: alert, cooperative and appears stated age Head: Normocephalic, without obvious abnormality, atraumatic Neck: no adenopathy, supple, symmetrical, trachea midline and thyroid normal to inspection and palpation Lungs: clear to auscultation bilaterally Breasts: normal appearance, no masses or tenderness Heart: regular rate and rhythm Abdomen: soft, non-tender; bowel sounds normal; no masses,  no organomegaly Extremities: extremities normal, atraumatic, no cyanosis or edema Skin: Skin color, texture, turgor normal. No rashes or lesions Lymph nodes: Cervical, supraclavicular, and axillary nodes normal. No abnormal inguinal nodes palpated Neurologic: Grossly normal   Pelvic: External genitalia:  no lesions              Urethra:   normal appearing urethra with no masses, tenderness or lesions              Bartholins and Skenes: normal                 Vagina: normal appearing vagina with normal color and discharge, no lesions              Cervix: no lesions              Pap taken: Yes.   Bimanual Exam:  Uterus:  normal size, contour, position, consistency, mobility, non-tender              Adnexa: normal adnexa and no mass, fullness, tenderness               Rectovaginal: Confirms               Anus:  normal sphincter tone, no lesions  Chaperone was present for exam.  A:  Well Woman with normal exam PMP, no HRT Mild renal insuffiencey possible from spirolactone use Vaginal atrophy Family hx of colon cancer/adenomatous polyps (mother and sister)  Colonoscopy done 7/16 after bowel obstruction) Hirsutism  P:   Mammogram is overdue.  Pt is aware.  Will try to schedule for pt today.  (She can go directly to breast center for appt today) BMD due.  Cannot be done with MMG today.  Order placed.  Pt will call to scheduled. Pap, reflex HR HPV obtained today Blood work/vaccines UTD Return annually or prn

## 2017-05-14 ENCOUNTER — Other Ambulatory Visit: Payer: Self-pay | Admitting: Obstetrics & Gynecology

## 2017-05-14 DIAGNOSIS — R5381 Other malaise: Secondary | ICD-10-CM

## 2017-05-16 ENCOUNTER — Other Ambulatory Visit: Payer: Self-pay | Admitting: *Deleted

## 2017-05-18 LAB — CYTOLOGY - PAP: Diagnosis: NEGATIVE

## 2017-05-20 ENCOUNTER — Other Ambulatory Visit: Payer: Self-pay | Admitting: Obstetrics & Gynecology

## 2017-05-20 DIAGNOSIS — E2839 Other primary ovarian failure: Secondary | ICD-10-CM

## 2017-07-26 ENCOUNTER — Ambulatory Visit
Admission: RE | Admit: 2017-07-26 | Discharge: 2017-07-26 | Disposition: A | Discharge status: Home / Self Care | Payer: Commercial Managed Care - PPO | Source: Ambulatory Visit | Attending: Obstetrics & Gynecology | Admitting: Obstetrics & Gynecology

## 2017-07-26 DIAGNOSIS — E2839 Other primary ovarian failure: Secondary | ICD-10-CM

## 2018-01-03 DIAGNOSIS — S42213A Unspecified displaced fracture of surgical neck of unspecified humerus, initial encounter for closed fracture: Secondary | ICD-10-CM | POA: Insufficient documentation

## 2018-01-23 ENCOUNTER — Ambulatory Visit (INDEPENDENT_AMBULATORY_CARE_PROVIDER_SITE_OTHER): Payer: Commercial Managed Care - PPO | Admitting: Rehabilitative and Restorative Service Providers"

## 2018-01-23 ENCOUNTER — Encounter: Payer: Self-pay | Admitting: Rehabilitative and Restorative Service Providers"

## 2018-01-23 DIAGNOSIS — R293 Abnormal posture: Secondary | ICD-10-CM

## 2018-01-23 DIAGNOSIS — R29898 Other symptoms and signs involving the musculoskeletal system: Secondary | ICD-10-CM | POA: Diagnosis not present

## 2018-01-23 DIAGNOSIS — M6281 Muscle weakness (generalized): Secondary | ICD-10-CM | POA: Diagnosis not present

## 2018-01-23 DIAGNOSIS — M25512 Pain in left shoulder: Secondary | ICD-10-CM

## 2018-01-23 NOTE — Patient Instructions (Signed)
Axial Extension (Chin Tuck)    Pull chin in and lengthen back of neck. Hold __5__ seconds while counting out loud. Repeat __10__ times. Do __several__ sessions per day.  Shoulder Blade Squeeze    Rotate shoulders back, then squeeze shoulder blades together. Repeat ____ times. Do ____ sessions per day.  Pendulum Circular    Bend forward 90 at waist, leaning on table for support. Rock body in a circular pattern to move arm clockwise _20-30___ times then counterclockwise _20-30__ times. Do _several ___ sessions per day.  TENS UNIT: This is helpful for muscle pain and spasm.   Search and Purchase a TENS 7000 2nd edition at www.tenspros.com. It should be less than $30.     TENS unit instructions: Do not shower or bathe with the unit on Turn the unit off before removing electrodes or batteries If the electrodes lose stickiness add a drop of water to the electrodes after they are disconnected from the unit and place on plastic sheet. If you continued to have difficulty, call the TENS unit company to purchase more electrodes. Do not apply lotion on the skin area prior to use. Make sure the skin is clean and dry as this will help prolong the life of the electrodes. After use, always check skin for unusual red areas, rash or other skin difficulties. If there are any skin problems, does not apply electrodes to the same area. Never remove the electrodes from the unit by pulling the wires. Do not use the TENS unit or electrodes other than as directed. Do not change electrode placement without consultating your therapist or physician. Keep 2 fingers with between each electrode. Wear time ratio is 2:1, on to off times.    Trigger Point Dry Needling  . What is Trigger Point Dry Needling (DN)? o DN is a physical therapy technique used to treat muscle pain and dysfunction. Specifically, DN helps deactivate muscle trigger points (muscle knots).  o A thin filiform needle is used to penetrate  the skin and stimulate the underlying trigger point. The goal is for a local twitch response (LTR) to occur and for the trigger point to relax. No medication of any kind is injected during the procedure.   . What Does Trigger Point Dry Needling Feel Like?  o The procedure feels different for each individual patient. Some patients report that they do not actually feel the needle enter the skin and overall the process is not painful. Very mild bleeding may occur. However, many patients feel a deep cramping in the muscle in which the needle was inserted. This is the local twitch response.   Marland Kitchen. How Will I feel after the treatment? o Soreness is normal, and the onset of soreness may not occur for a few hours. Typically this soreness does not last longer than two days.  o Bruising is uncommon, however; ice can be used to decrease any possible bruising.  o In rare cases feeling tired or nauseous after the treatment is normal. In addition, your symptoms may get worse before they get better, this period will typically not last longer than 24 hours.   . What Can I do After My Treatment? o Increase your hydration by drinking more water for the next 24 hours. o You may place ice or heat on the areas treated that have become sore, however, do not use heat on inflamed or bruised areas. Heat often brings more relief post needling. o You can continue your regular activities, but vigorous activity is not  recommended initially after the treatment for 24 hours. o DN is best combined with other physical therapy such as strengthening, stretching, and other therapies.      [facility] [address] [place], [place] [address]  [phone] (office) [phone] (fax)

## 2018-01-23 NOTE — Therapy (Signed)
[facility] [place] [address] [place], [place], [address] Phone: [phone]   Fax:  [phone]  Physical Therapy Evaluation  Patient Details  Name: Mary Trujillo MRN: [id number] Date of Birth: 09-Aug-1955 Referring Provider: Dr Jene Every   Encounter Date: 01/23/2018  PT End of Session - 01/23/18 1701    Visit Number  1    Number of Visits  12    Date for PT Re-Evaluation  03/06/18    PT Start Time  1607    PT Stop Time  1705    PT Time Calculation (min)  58 min    Activity Tolerance  Patient tolerated treatment well       Past Medical History:  Diagnosis Date  . Abnormal pap 2005   CIN 1- Colpo CIN1, Neg ECC  . Ankle fracture   . Anxiety   . Colitis   . COPD (chronic obstructive pulmonary disease) (HCC) 4/15   had spirometry   . Hirsutism   . History of PCOS   . Hx of migraines   . Osteopenia     Past Surgical History:  Procedure Laterality Date  . APPENDECTOMY    . CATARACT EXTRACTION, BILATERAL Bilateral 04/2017  . PARTIAL COLECTOMY Right 12/2002   with appendectomy    There were no vitals filed for this visit.   Subjective Assessment - 01/23/18 1612    Subjective  Patient reports that she was ice skating 12/08/17 when she was turning to do figure 8 jump. She sustained fracture proximal humerus with partial tear of biceps; fx Lt rib; injury to Lt knee.  She was in a sling for ~ 5 weeks with sling discontinued last week. Patient report Lt shoudler pain with certain movements and any quick movement, especially turning arm out to side and reacinhing up back.     Diagnostic tests  xrays     Patient Stated Goals  to get back to normal and return to skating     Currently in Pain?  Yes    Pain Score  0-No pain   6/10 with sudden or worng movements    Pain Location  Shoulder    Pain Orientation  Left    Pain Descriptors / Indicators  Sharp;Burning    Pain Type  Acute pain    Pain Radiating Towards  into  the biceps area     Pain Onset  More than a month ago    Pain Frequency  Intermittent    Aggravating Factors   sudden movements; rolling over onto the Lt side at night(favorite sleeping side); moving in certain directioins - out to side or hand behind back     Pain Relieving Factors  ice; hot shower; OTC meds         [facility] PT Assessment - 01/23/18 0001      Assessment   Medical Diagnosis  Fracture Lt proximal humerus    Referring Provider  Dr Jene Every    Onset Date/Surgical Date  12/08/17    Hand Dominance  Right    Next MD Visit  01/31/18    Prior Therapy  none for shoulder       Precautions   Precaution Comments  no lifing; no planking; no wt bearing       Balance Screen   Has the patient fallen in the past 6 months  Yes    How many times?  1    Has the patient had a decrease in activity  level because of a fear of falling?   No    Is the patient reluctant to leave their home because of a fear of falling?   No      Prior Function   Level of Independence  Independent    Vocation  Full time employment    Lexicographer - measuring floor spaces; Hotel manager     Leisure  skating 1-2 times/wk for 1 hour - for years; household chores; walking 20 min 2 x/wk       Observation/Other Assessments   Focus on Therapeutic Outcomes (FOTO)   59% limitation       Sensation   Additional Comments  WFL's per pt report       Posture/Postural Control   Posture Comments  head farward; shoudler rounded and eelvated       AROM   Right Shoulder Extension  77 Degrees    Right Shoulder Flexion  151 Degrees    Right Shoulder ABduction  150 Degrees    Right Shoulder Internal Rotation  35 Degrees    Right Shoulder External Rotation  96 Degrees    Left Shoulder Extension  38 Degrees   pain with return to neutral    Left Shoulder Flexion  120 Degrees   abnormal mvt pattern - compensatory scapular movement    Left Shoulder ABduction  90 Degrees   abnormal  movement pattern - compensatory scapular movement   Left Shoulder Internal Rotation  18 Degrees   pain and compensatory movement patterns   Left Shoulder External Rotation  29 Degrees   pain and compensatory movement patterns    Cervical Flexion  80    Cervical Extension  62    Cervical - Right Side Bend  51    Cervical - Left Side Bend  50    Cervical - Right Rotation  67    Cervical - Left Rotation  63      Strength   Overall Strength Comments  Rt UE - WFL's; not tested Lt UE       Palpation   Palpation comment  significnat muscular tightness through the Lt upper quarter - pecs; upper trap; leveator; teres; biceps; triceps                 Objective measurements completed on examination: See above findings.      [facility] Adult PT Treatment/Exercise - 01/23/18 0001      Therapeutic Activites    Therapeutic Activities  --   myofacial ball release work      Neuro Re-ed    Neuro Re-ed Details   initiated postural correction       Shoulder Exercises: Standing   Other Standing Exercises  axial extension 10 sec x 5; scap squeeze 10 sec x 10 with noodle       Shoulder Exercises: ROM/Strengthening   Pendulum  20 CW/20CCW       Moist Heat Therapy   Number Minutes Moist Heat  15 Minutes    Moist Heat Location  Shoulder   Lt     Electrical Stimulation   Electrical Stimulation Location  Lt shoulder girdle     Electrical Stimulation Action  IFC    Electrical Stimulation Parameters  to tolerance    Electrical Stimulation Goals  Pain;Tone             PT Education - 01/23/18 1656    Education Details  HEP TENS DN     Person(s) Educated  Patient    Methods  Explanation;Demonstration;Tactile cues;Verbal cues;Handout    Comprehension  Verbalized understanding;Returned demonstration;Verbal cues required;Tactile cues required          PT Long Term Goals - 01/23/18 1714      PT LONG TERM GOAL #1   Title  Improve posture and alignment with patient demonstrating  improved upright posture with posterior shoulder girdle engaged 03/06/18    Time  6    Period  Weeks    Status  New      PT LONG TERM GOAL #2   Title  Increase AROM Lt shoulder to within 5-10 degrees of AROM Rt shoulder 03/06/18    Time  6    Period  Weeks    Status  New      PT LONG TERM GOAL #3   Title  Increase functional activities with patient using Lt UE to lift items such as groceries and clothing; reaching into closets/cabinets for light weight items with minimal to no pain 03/06/18    Time  6    Period  Weeks    Status  New      PT LONG TERM GOAL #4   Title  Independent in HEP 03/06/18    Time  6    Period  Weeks    Status  New      PT LONG TERM GOAL #5   Title  Improve FOTO to </= 37% limitation 03/06/18    Time  6    Period  Weeks    Status  New             Plan - 01/23/18 1703    Clinical Impression Statement  Patient presents s/p fx Lt proximal humerus 12/08/17. She remained in a sling for ~ 5 weeks with sling discontinued ~ 1 week ago. Patient has continued Lt shoulder and arm pain with ROM and functional activities. She is restricted from any lifting. Lanora Manislizabeth presents with poor posture and alignment; abnormal movement patterns with compensatory movement through the shoulder girdle and scapulae; limited AROM Rt shoulder; pain with active movement Lt shoulder; significant muscular tightness and pain with palpatin Lt shoulder girdle into the Lt arm. Patient will benefit from PT to address problems identified.     Clinical Presentation  Stable    Clinical Decision Making  Low    Rehab Potential  Good    Clinical Impairments Affecting Rehab Potential  porward posture; abnormal movement patterns Lt UE; muscular imbalance; muscular tightness     PT Frequency  2x / week    PT Duration  6 weeks    PT Treatment/Interventions  Patient/family education;ADLs/Self Care Home Management;Cryotherapy;Electrical Stimulation;Iontophoresis 4mg /ml Dexamethasone;Moist  Heat;Ultrasound;Dry needling;Manual techniques;Neuromuscular re-education;Therapeutic activities;Therapeutic exercise    PT Next Visit Plan  review HEP; progress with ROM exercises - PROM initially; DN/manual work Lt shoudler girdle as indicated; modalities as indicated     Financial plannerConsulted and Agree with Plan of Care  Patient       Patient will benefit from skilled therapeutic intervention in order to improve the following deficits and impairments:  Postural dysfunction, Improper body mechanics, Pain, Increased fascial restricitons, Increased muscle spasms, Decreased mobility, Decreased range of motion, Decreased strength, Decreased activity tolerance  Visit Diagnosis: Acute pain of left shoulder - Plan: PT plan of care cert/re-cert  Other symptoms and signs involving the musculoskeletal system - Plan: PT plan of care cert/re-cert  Muscle weakness (generalized) - Plan: PT plan of care cert/re-cert  Abnormal posture - Plan:  PT plan of care cert/re-cert     Problem List Patient Active Problem List   Diagnosis Date Noted  . Herpes simplex type 1 infection 12/01/2013  . Cervical sympathetic dystrophy 12/01/2013  . FO (foramen ovale) 12/01/2013  . Renal insufficiency, mild 12/01/2013  . 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 CAFL (chronic airflow limitation) (HCC) 09/27/2013  . Urinary system disease 07/18/2013  . Bilateral polycystic ovarian syndrome 07/18/2013  . Polycystic ovaries 07/18/2013  . ASD (atrial septal defect), ostium secundum 07/18/2013    Lillyen Schow Rober Minion PT, MPH 01/23/2018, 5:21 PM  [facility] [address] [place], [place], [address] Phone: [phone]   Fax:  [phone]  Name: Mary Trujillo MRN: [id number] Date of Birth: Jan 29, 1956

## 2018-01-25 ENCOUNTER — Encounter: Payer: Commercial Managed Care - PPO | Admitting: Rehabilitative and Restorative Service Providers"

## 2018-01-26 ENCOUNTER — Ambulatory Visit (INDEPENDENT_AMBULATORY_CARE_PROVIDER_SITE_OTHER): Payer: Commercial Managed Care - PPO | Admitting: Physical Therapy

## 2018-01-26 DIAGNOSIS — R293 Abnormal posture: Secondary | ICD-10-CM | POA: Diagnosis not present

## 2018-01-26 DIAGNOSIS — M25512 Pain in left shoulder: Secondary | ICD-10-CM

## 2018-01-26 DIAGNOSIS — M6281 Muscle weakness (generalized): Secondary | ICD-10-CM | POA: Diagnosis not present

## 2018-01-26 DIAGNOSIS — R29898 Other symptoms and signs involving the musculoskeletal system: Secondary | ICD-10-CM | POA: Diagnosis not present

## 2018-01-26 NOTE — Therapy (Signed)
[facility] [place] [address] [place], [place], [address] Phone: [phone]   Fax:  [phone]  Physical Therapy Treatment  Patient Details  Name: Mary Trujillo MRN: [id number] Date of Birth: 04/08/1956 Referring Provider: Dr Jene Every   Encounter Date: 01/26/2018  PT End of Session - 01/26/18 0902    Visit Number  2    Number of Visits  12    Date for PT Re-Evaluation  03/06/18    PT Start Time  0848    PT Stop Time  0927    PT Time Calculation (min)  39 min       Past Medical History:  Diagnosis Date  . Abnormal pap 2005   CIN 1- Colpo CIN1, Neg ECC  . Ankle fracture   . Anxiety   . Colitis   . COPD (chronic obstructive pulmonary disease) (HCC) 4/15   had spirometry   . Hirsutism   . History of PCOS   . Hx of migraines   . Osteopenia     Past Surgical History:  Procedure Laterality Date  . APPENDECTOMY    . CATARACT EXTRACTION, BILATERAL Bilateral 04/2017  . PARTIAL COLECTOMY Right 12/2002   with appendectomy    There were no vitals filed for this visit.  Subjective Assessment - 01/26/18 0849    Subjective  Lt shoulder was doing this "twinging thing" at work the day after last visit; had to take pain med to calm it down.  she is having difficult time sleeping at night.     Currently in Pain?  No/denies    Pain Score  --   4/10 with certain movements        [facility] PT Assessment - 01/26/18 0001      Assessment   Medical Diagnosis  Fracture Lt proximal humerus    Referring Provider  Dr Jene Every    Onset Date/Surgical Date  12/08/17    Hand Dominance  Right    Next MD Visit  01/31/18       [facility] Adult PT Treatment/Exercise - 01/26/18 0001      Self-Care   Self-Care  Other Self-Care Comments    Other Self-Care Comments   pt educated on self massage with ball to Lt shoulder girdle and pec; pt verbalized understanding and returned demo with cues.       Exercises   Exercises   Shoulder;Neck      Neck Exercises: Seated   Neck Retraction  10 reps;3 secs    Neck Retraction Limitations  repeated cues for improved form    Other Seated Exercise  scap retraction x 5 sec x 10 reps (with tactile cues for form)      Shoulder Exercises: Supine   Flexion  AROM;Left;5 reps;Limitations   within tissue limits and no pain, eccentric lowering   Flexion Limitations  cues to keep shoulder from elevating    Other Supine Exercises  prolonged bilat horz abdct with towel under Lt upper arm (arm ~80 deg) x 30 sec x 3 reps, 2 reps afterward without towel (improved tolerance)      Shoulder Exercises: Seated   Flexion  Left;5 reps;AROM   to less than 90 deg, without elevating scapula     Shoulder Exercises: Stretch   Table Stretch - Flexion  5 reps;10 seconds      Modalities   Modalities  --   pt declined; time constraints     Manual Therapy   Manual Therapy  Myofascial release;Soft tissue mobilization    Manual therapy comments  Pt supine, with LUE supported.     Soft tissue mobilization  gentle STM to Lt lat, subscap, deltoid, and pec    Myofascial Release  MFR to Lt pec major, Lat.             PT Education - 01/26/18 0926    Education Details  HEP, added table slide and prolonged snow angel; self massage.     Person(s) Educated  Patient    Methods  Explanation;Demonstration;Handout    Comprehension  Verbalized understanding;Returned demonstration          PT Long Term Goals - 01/23/18 1714      PT LONG TERM GOAL #1   Title  Improve posture and alignment with patient demonstrating improved upright posture with posterior shoulder girdle engaged 03/06/18    Time  6    Period  Weeks    Status  New      PT LONG TERM GOAL #2   Title  Increase AROM Lt shoulder to within 5-10 degrees of AROM Rt shoulder 03/06/18    Time  6    Period  Weeks    Status  New      PT LONG TERM GOAL #3   Title  Increase functional activities with patient using Lt UE to lift items  such as groceries and clothing; reaching into closets/cabinets for light weight items with minimal to no pain 03/06/18    Time  6    Period  Weeks    Status  New      PT LONG TERM GOAL #4   Title  Independent in HEP 03/06/18    Time  6    Period  Weeks    Status  New      PT LONG TERM GOAL #5   Title  Improve FOTO to </= 37% limitation 03/06/18    Time  6    Period  Weeks    Status  New            Plan - 01/26/18 0934    Clinical Impression Statement  Observed pt to have limited scapular mobility in sitting with scap squeeze exercise.  Pt somewhat guarded with exercise and manual therapy.  Pt reported reduction of tightness in shoulder and greater ease getting dressed after session.   Progressing towards goals.     Rehab Potential  Good    PT Frequency  2x / week    PT Duration  6 weeks    PT Treatment/Interventions  Patient/family education;ADLs/Self Care Home Management;Cryotherapy;Electrical Stimulation;Iontophoresis 4mg /ml Dexamethasone;Moist Heat;Ultrasound;Dry needling;Manual techniques;Neuromuscular re-education;Therapeutic activities;Therapeutic exercise    PT Next Visit Plan  PROM initially, progressing ROM exercises.  DN/manual therapy as indicated.      Consulted and Agree with Plan of Care  Patient       Patient will benefit from skilled therapeutic intervention in order to improve the following deficits and impairments:  Postural dysfunction, Improper body mechanics, Pain, Increased fascial restricitons, Increased muscle spasms, Decreased mobility, Decreased range of motion, Decreased strength, Decreased activity tolerance  Visit Diagnosis: Acute pain of left shoulder  Other symptoms and signs involving the musculoskeletal system  Muscle weakness (generalized)  Abnormal posture     Problem List Patient Active Problem List   Diagnosis Date Noted  . Herpes simplex type 1 infection 12/01/2013  . Cervical sympathetic dystrophy 12/01/2013  . FO (foramen  ovale) 12/01/2013  . Renal insufficiency, mild 12/01/2013  .  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 CAFL (chronic airflow limitation) (HCC) 09/27/2013  . Urinary system disease 07/18/2013  . Bilateral polycystic ovarian syndrome 07/18/2013  . Polycystic ovaries 07/18/2013  . ASD (atrial septal defect), ostium secundum 07/18/2013    Salvadore Oxford 01/26/2018, 9:49 AM  [facility] [address] [place] [address] [place], [place], [address] Phone: [phone]   Fax:  [phone]  Name: Mary Trujillo MRN: [id number] Date of Birth: 1955/07/22

## 2018-01-26 NOTE — Patient Instructions (Signed)
[place]: Double Arm    Arms near sides, palms up. slide arms out to side -out to 90 degrees.  (may need towel roll under Left arm)  Hold for 30 seconds x 3 reps . You can do this in the bed with arms hanging off bed.   Flexion (Passive)    Sitting upright, slide forearm forward along table, bending from the waist until a stretch is felt. Hold __10__ seconds. Repeat __5-10__ times. Do __2__ sessions per day.  *Self massage with ball to Left pec and back of the shoulder girdle.  3-5 min, 1x / day, as needed

## 2018-01-30 ENCOUNTER — Encounter: Payer: Self-pay | Admitting: Rehabilitative and Restorative Service Providers"

## 2018-01-30 ENCOUNTER — Ambulatory Visit (INDEPENDENT_AMBULATORY_CARE_PROVIDER_SITE_OTHER): Payer: Commercial Managed Care - PPO | Admitting: Rehabilitative and Restorative Service Providers"

## 2018-01-30 DIAGNOSIS — M25512 Pain in left shoulder: Secondary | ICD-10-CM | POA: Diagnosis not present

## 2018-01-30 DIAGNOSIS — R29898 Other symptoms and signs involving the musculoskeletal system: Secondary | ICD-10-CM | POA: Diagnosis not present

## 2018-01-30 DIAGNOSIS — R293 Abnormal posture: Secondary | ICD-10-CM | POA: Diagnosis not present

## 2018-01-30 DIAGNOSIS — M6281 Muscle weakness (generalized): Secondary | ICD-10-CM

## 2018-01-30 NOTE — Patient Instructions (Signed)
ELBOW: Biceps - Standing    Standing in doorway, place one hand on wall, elbow straight. Lean forward. Hold _30__ seconds. _3__ reps per set, _3-4__ times per day

## 2018-01-30 NOTE — Therapy (Signed)
[facility] [place] [address] [place] [address] [place], [place], [address] Phone: [phone]   Fax:  [phone]  Physical Therapy Treatment  Patient Details  Name: Mary Trujillo MRN: [id number] Date of Birth: 02-Jul-1955 Referring Provider (PT): Dr Jene Every   Encounter Date: 01/30/2018  PT End of Session - 01/30/18 1602    Visit Number  3    Number of Visits  12    Date for PT Re-Evaluation  03/06/18    PT Start Time  1600    PT Stop Time  1700    PT Time Calculation (min)  60 min    Activity Tolerance  Patient tolerated treatment well       Past Medical History:  Diagnosis Date  . Abnormal pap 2005   CIN 1- Colpo CIN1, Neg ECC  . Ankle fracture   . Anxiety   . Colitis   . COPD (chronic obstructive pulmonary disease) (HCC) 4/15   had spirometry   . Hirsutism   . History of PCOS   . Hx of migraines   . Osteopenia     Past Surgical History:  Procedure Laterality Date  . APPENDECTOMY    . CATARACT EXTRACTION, BILATERAL Bilateral 04/2017  . PARTIAL COLECTOMY Right 12/2002   with appendectomy    There were no vitals filed for this visit.  Subjective Assessment - 01/30/18 1602    Subjective  Aching - which is now constant at about a 4/10. Was riding in a car that stopped suddendly this weekend and she experienced some increased in Lt shoulder pain.     Currently in Pain?  Yes    Pain Score  4     Pain Location  Shoulder    Pain Orientation  Left    Pain Descriptors / Indicators  Aching    Pain Type  Acute pain    Pain Onset  More than a month ago    Pain Frequency  Intermittent                       [facility] Adult PT Treatment/Exercise - 01/30/18 0001      Shoulder Exercises: Supine   Flexion  AAROM;Left    Flexion Limitations  assisting with Rt UE 10-15 sec hold x 3 reps     Other Supine Exercises  prolonged snow angel 20-30 sec UE abd to pt tolerance       Shoulder Exercises: Stretch   Table  Stretch - Flexion  5 reps;10 seconds    Other Shoulder Stretches  biceps stretch 30 sec x 3 standing       Moist Heat Therapy   Number Minutes Moist Heat  20 Minutes    Moist Heat Location  Shoulder   Lt shd/thoracic spine      Electrical Stimulation   Electrical Stimulation Location  Lt shoulder girdle     Electrical Stimulation Action  IFC    Electrical Stimulation Parameters  to tolerance    Electrical Stimulation Goals  Pain;Tone      Manual Therapy   Manual therapy comments  Pt supine, with LUE supported.     Joint Mobilization  circumduction Lt GH joint     Soft tissue mobilization  STM to pt tolerance through Lt shoulder girdle - upper trap; leveator; medial scapular border; biceps; teres; lats/lateral scap border; deltoid     Myofascial Release  upper trap; pec     Passive ROM  PROM and end  range stretch Lt GH joint into flexion; ER/IR in scapular plane with gentle distraction of joint              PT Education - 01/30/18 1613    Education Details  HEP     Person(s) Educated  Patient    Methods  Explanation;Demonstration;Tactile cues;Verbal cues;Handout    Comprehension  Verbalized understanding;Returned demonstration;Verbal cues required;Tactile cues required          PT Long Term Goals - 01/23/18 1714      PT LONG TERM GOAL #1   Title  Improve posture and alignment with patient demonstrating improved upright posture with posterior shoulder girdle engaged 03/06/18    Time  6    Period  Weeks    Status  New      PT LONG TERM GOAL #2   Title  Increase AROM Lt shoulder to within 5-10 degrees of AROM Rt shoulder 03/06/18    Time  6    Period  Weeks    Status  New      PT LONG TERM GOAL #3   Title  Increase functional activities with patient using Lt UE to lift items such as groceries and clothing; reaching into closets/cabinets for light weight items with minimal to no pain 03/06/18    Time  6    Period  Weeks    Status  New      PT LONG TERM GOAL #4    Title  Independent in HEP 03/06/18    Time  6    Period  Weeks    Status  New      PT LONG TERM GOAL #5   Title  Improve FOTO to </= 37% limitation 03/06/18    Time  6    Period  Weeks    Status  New            Plan - 01/30/18 1651    Clinical Impression Statement  Patient continues to have increased pain in the Lt shoulder girdle. She has significant muscular tightness to palpation with limited ROM. Tolerated PROM with periods of rest and manual work during treatment.     Rehab Potential  Good    PT Frequency  2x / week    PT Duration  6 weeks    PT Treatment/Interventions  Patient/family education;ADLs/Self Care Home Management;Cryotherapy;Electrical Stimulation;Iontophoresis 4mg /ml Dexamethasone;Moist Heat;Ultrasound;Dry needling;Manual techniques;Neuromuscular re-education;Therapeutic activities;Therapeutic exercise    PT Next Visit Plan  PROM initially, progressing ROM exercises.  DN/manual therapy as indicated.      Consulted and Agree with Plan of Care  Patient       Patient will benefit from skilled therapeutic intervention in order to improve the following deficits and impairments:  Postural dysfunction, Improper body mechanics, Pain, Increased fascial restricitons, Increased muscle spasms, Decreased mobility, Decreased range of motion, Decreased strength, Decreased activity tolerance  Visit Diagnosis: Acute pain of left shoulder  Other symptoms and signs involving the musculoskeletal system  Muscle weakness (generalized)  Abnormal posture     Problem List Patient Active Problem List   Diagnosis Date Noted  . Herpes simplex type 1 infection 12/01/2013  . Cervical sympathetic dystrophy 12/01/2013  . FO (foramen ovale) 12/01/2013  . Renal insufficiency, mild 12/01/2013  . 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 CAFL (chronic airflow limitation) (HCC) 09/27/2013  . Urinary system disease 07/18/2013  . Bilateral polycystic ovarian syndrome 07/18/2013  . Polycystic  ovaries 07/18/2013  . ASD (atrial septal defect), ostium secundum 07/18/2013  Emillio Ngo Rober Minion PT, MPH  01/30/2018, 4:53 PM  [facility] [address] [place], [place], [address] Phone: [phone]   Fax:  [phone]  Name: PRINCESS KARNES MRN: [id number] Date of Birth: April 30, 1956

## 2018-02-01 ENCOUNTER — Encounter: Payer: Self-pay | Admitting: Rehabilitative and Restorative Service Providers"

## 2018-02-01 ENCOUNTER — Ambulatory Visit (INDEPENDENT_AMBULATORY_CARE_PROVIDER_SITE_OTHER): Payer: Commercial Managed Care - PPO | Admitting: Rehabilitative and Restorative Service Providers"

## 2018-02-01 DIAGNOSIS — M6281 Muscle weakness (generalized): Secondary | ICD-10-CM | POA: Diagnosis not present

## 2018-02-01 DIAGNOSIS — R293 Abnormal posture: Secondary | ICD-10-CM | POA: Diagnosis not present

## 2018-02-01 DIAGNOSIS — R29898 Other symptoms and signs involving the musculoskeletal system: Secondary | ICD-10-CM | POA: Diagnosis not present

## 2018-02-01 DIAGNOSIS — M25512 Pain in left shoulder: Secondary | ICD-10-CM | POA: Diagnosis not present

## 2018-02-01 NOTE — Therapy (Signed)
[facility] [place] [address] [place] [address] [place], [place], [address] Phone: [phone]   Fax:  [phone]  Physical Therapy Treatment  Patient Details  Name: Mary Trujillo MRN: [id number] Date of Birth: [birth year] Referring Provider (PT): Dr Jene Every   Encounter Date: 02/01/2018  PT End of Session - 02/01/18 1607    Visit Number  4    Number of Visits  12    Date for PT Re-Evaluation  03/06/18    PT Start Time  1600    PT Stop Time  1700    PT Time Calculation (min)  60 min       Past Medical History:  Diagnosis Date  . Abnormal pap 2005   CIN 1- Colpo CIN1, Neg ECC  . Ankle fracture   . Anxiety   . Colitis   . COPD (chronic obstructive pulmonary disease) (HCC) 4/15   had spirometry   . Hirsutism   . History of PCOS   . Hx of migraines   . Osteopenia     Past Surgical History:  Procedure Laterality Date  . APPENDECTOMY    . CATARACT EXTRACTION, BILATERAL Bilateral 04/2017  . PARTIAL COLECTOMY Right 12/2002   with appendectomy    There were no vitals filed for this visit.  Subjective Assessment - 02/01/18 1609    Subjective  Seen by Md yesterday and he was pleased with her progress. Wants her to wait for another month before returning to skating. Tahj reports that feels she can move her arm a little better.     Currently in Pain?  No/denies    Pain Score  0-No pain    Pain Location  Shoulder                       [facility] Adult PT Treatment/Exercise - 02/01/18 0001      Shoulder Exercises: Supine   Other Supine Exercises  prolonged snow angel 20-30 sec UE abd to pt tolerance    added stretch for forearm and wrist extensors 20 sec x 2      Shoulder Exercises: Stretch   Internal Rotation Stretch  3 reps    Internal Rotation Stretch Limitations  20-30 sec x 3 reps     Table Stretch -Flexion Limitations  step back from table for shoulder flexion - 20-30 sec x 3 reps     Other Shoulder  Stretches  biceps stretch 30 sec x 3 standing     Other Shoulder Stretches  shoulder extension with cane - 20 sec x 3 assisting with Rt UE; pulling Lt hand across hips posteriorly       Moist Heat Therapy   Number Minutes Moist Heat  20 Minutes    Moist Heat Location  Shoulder   Lt shd/thoracic spine      Electrical Stimulation   Electrical Stimulation Location  Lt shoulder girdle     Electrical Stimulation Action  IFC    Electrical Stimulation Parameters  to tolerance    Electrical Stimulation Goals  Pain;Tone      Manual Therapy   Manual therapy comments  Pt supine, with LUE supported.     Joint Mobilization  circumduction Lt GH joint     Soft tissue mobilization  STM to pt tolerance through Lt shoulder girdle - upper trap; leveator; medial scapular border; biceps; teres; lats/lateral scap border; deltoid     Myofascial Release  upper trap; pec     Passive ROM  PROM and end range stretch Lt GH joint into flexion; ER/IR in scapular plane with gentle distraction of joint              PT Education - 02/01/18 1621    Education Details  HEP     Person(s) Educated  Patient    Methods  Explanation;Demonstration;Tactile cues;Verbal cues;Handout    Comprehension  Verbalized understanding;Returned demonstration;Verbal cues required;Tactile cues required          PT Long Term Goals - 01/23/18 1714      PT LONG TERM GOAL #1   Title  Improve posture and alignment with patient demonstrating improved upright posture with posterior shoulder girdle engaged 03/06/18    Time  6    Period  Weeks    Status  New      PT LONG TERM GOAL #2   Title  Increase AROM Lt shoulder to within 5-10 degrees of AROM Rt shoulder 03/06/18    Time  6    Period  Weeks    Status  New      PT LONG TERM GOAL #3   Title  Increase functional activities with patient using Lt UE to lift items such as groceries and clothing; reaching into closets/cabinets for light weight items with minimal to no pain 03/06/18     Time  6    Period  Weeks    Status  New      PT LONG TERM GOAL #4   Title  Independent in HEP 03/06/18    Time  6    Period  Weeks    Status  New      PT LONG TERM GOAL #5   Title  Improve FOTO to </= 37% limitation 03/06/18    Time  6    Period  Weeks    Status  New            Plan - 02/01/18 1609    Clinical Impression Statement  MD pleased with progress, states that the fracture is healed and it is OK for her to work toward increasing ROM/strength. Patient states that she is now more confident in doing her exercises and has noticed she has less pain and more movement in the shoulder in general. Patient added exercises today with minimal difficulty. Tolerated PROM and stretching with manual work well. Excellent progress this week.     Rehab Potential  Good    PT Frequency  2x / week    PT Duration  6 weeks    PT Treatment/Interventions  Patient/family education;ADLs/Self Care Home Management;Cryotherapy;Electrical Stimulation;Iontophoresis 4mg /ml Dexamethasone;Moist Heat;Ultrasound;Dry needling;Manual techniques;Neuromuscular re-education;Therapeutic activities;Therapeutic exercise    PT Next Visit Plan  PROM initially, progressing ROM exercises.  DN/manual therapy as indicated.      Consulted and Agree with Plan of Care  Patient       Patient will benefit from skilled therapeutic intervention in order to improve the following deficits and impairments:  Postural dysfunction, Improper body mechanics, Pain, Increased fascial restricitons, Increased muscle spasms, Decreased mobility, Decreased range of motion, Decreased strength, Decreased activity tolerance  Visit Diagnosis: Acute pain of left shoulder  Other symptoms and signs involving the musculoskeletal system  Muscle weakness (generalized)  Abnormal posture     Problem List Patient Active Problem List   Diagnosis Date Noted  . Herpes simplex type 1 infection 12/01/2013  . Cervical sympathetic dystrophy  12/01/2013  . FO (foramen ovale) 12/01/2013  . Renal insufficiency, mild 12/01/2013  . Horner's syndrome  12/01/2013  . CAFL (chronic airflow limitation) (HCC) 09/27/2013  . Urinary system disease 07/18/2013  . Bilateral polycystic ovarian syndrome 07/18/2013  . Polycystic ovaries 07/18/2013  . ASD (atrial septal defect), ostium secundum 07/18/2013    Celyn Rober Minion PT, MPH  02/01/2018, 5:03 PM  [facility] [address] [place] [place], [place], [address] Phone: [phone]   Fax:  [phone]  Name: AILA TERRA MRN: [id number] Date of Birth: 1956/01/24

## 2018-02-01 NOTE — Patient Instructions (Addendum)
Flexors Stretch, Standing    Stand about a thigh's length from table. Grip edges of table. Bend knees until stretch is felt under shoulder blades in chest. Hold _20-30__ seconds. Repeat _2-3__ times per session. Do _2-3__ sessions per day.   Anterior Capsule Stretch, Standing    Stand holding stick behind back, hands palms up. Lift stick away from body as far as possible. Hold _20__ seconds. Repeat _3-5__ times per session. Do _1-2__ sessions per day.  Pull left arm across hips behind you - hold 15-20 sec repeat 2-3 times   Internal Rotator Cuff Stretch, Standing     Stand holding club behind body, one arm above head, other arm bent behind back. With upper hand, pull gently upward. Hold _20__ seconds. Repeat _2-3__ times per session. Do __1-2_ sessions per day.

## 2018-02-06 ENCOUNTER — Ambulatory Visit (INDEPENDENT_AMBULATORY_CARE_PROVIDER_SITE_OTHER): Payer: Commercial Managed Care - PPO | Admitting: Rehabilitative and Restorative Service Providers"

## 2018-02-06 ENCOUNTER — Encounter: Payer: Self-pay | Admitting: Rehabilitative and Restorative Service Providers"

## 2018-02-06 DIAGNOSIS — R29898 Other symptoms and signs involving the musculoskeletal system: Secondary | ICD-10-CM | POA: Diagnosis not present

## 2018-02-06 DIAGNOSIS — M25512 Pain in left shoulder: Secondary | ICD-10-CM

## 2018-02-06 DIAGNOSIS — M6281 Muscle weakness (generalized): Secondary | ICD-10-CM

## 2018-02-06 DIAGNOSIS — R293 Abnormal posture: Secondary | ICD-10-CM

## 2018-02-06 NOTE — Therapy (Signed)
[facility] [place] [address] [place], [place], [address] Phone: [phone]   Fax:  [phone]  Physical Therapy Treatment  Patient Details  Name: Mary Trujillo MRN: [id number] Date of Birth: 01/18/1956 Referring Provider (PT): Dr Jene Every   Encounter Date: 02/06/2018  PT End of Session - 02/06/18 0721    Visit Number  5    Number of Visits  12    Date for PT Re-Evaluation  03/06/18    PT Start Time  0716    PT Stop Time  0803    PT Time Calculation (min)  47 min    Activity Tolerance  Patient tolerated treatment well       Past Medical History:  Diagnosis Date  . Abnormal pap 2005   CIN 1- Colpo CIN1, Neg ECC  . Ankle fracture   . Anxiety   . Colitis   . COPD (chronic obstructive pulmonary disease) (HCC) 4/15   had spirometry   . Hirsutism   . History of PCOS   . Hx of migraines   . Osteopenia     Past Surgical History:  Procedure Laterality Date  . APPENDECTOMY    . CATARACT EXTRACTION, BILATERAL Bilateral 04/2017  . PARTIAL COLECTOMY Right 12/2002   with appendectomy    There were no vitals filed for this visit.  Subjective Assessment - 02/06/18 0722    Subjective  Patient reports that her shoulder is "pretty good". She has less pain and movement is gradually increasing.     Currently in Pain?  No/denies         [facility] PT Assessment - 02/06/18 0001      Assessment   Medical Diagnosis  Fracture Lt proximal humerus    Referring Provider (PT)  Dr Jene Every    Onset Date/Surgical Date  12/08/17    Hand Dominance  Right    Next MD Visit  01/31/18      AROM   Left Shoulder Flexion  130 Degrees   improved scapular position - continued elevation of scap      PROM   Left Shoulder Flexion  134 Degrees    Left Shoulder ABduction  132 Degrees    Left Shoulder External Rotation  60 Degrees                   [facility] Adult PT Treatment/Exercise - 02/06/18 0001      Shoulder  Exercises: Supine   Other Supine Exercises  prolonged snow angel 20-30 sec UE abd to pt tolerance    added stretch for forearm and wrist extensors 20 sec x 2      Shoulder Exercises: Standing   Row  Strengthening;Right;Left;10 reps;Theraband    Theraband Level (Shoulder Row)  Level 2 (Red)    Other Standing Exercises  axial extension 10 sec x 5; scap squeeze 10 sec x 10 with noodle     Other Standing Exercises  L's x 10 with noodle       Shoulder Exercises: Pulleys   Flexion  --   10 sec hold x 10    Scaption  --   10 sec hold x 10 reps      Shoulder Exercises: Therapy Ball   Other Therapy Ball Exercises  scapular depression 5 sec x 10       Shoulder Exercises: ROM/Strengthening   Pendulum  20 CW/20CCW       Manual Therapy   Manual therapy comments  Pt supine,  with LUE supported.     Joint Mobilization  circumduction Lt GH joint     Soft tissue mobilization  STM to pt tolerance through Lt shoulder girdle - upper trap; leveator; medial scapular border; biceps; teres; lats/lateral scap border; deltoid     Myofascial Release  upper trap; pec     Passive ROM  PROM and end range stretch Lt GH joint into flexion; ER/IR in scapular plane with gentle distraction of joint              PT Education - 02/06/18 0741    Education Details  HEP     Person(s) Educated  Patient    Methods  Explanation;Demonstration;Verbal cues;Tactile cues;Handout    Comprehension  Verbalized understanding;Returned demonstration;Verbal cues required;Tactile cues required          PT Long Term Goals - 01/23/18 1714      PT LONG TERM GOAL #1   Title  Improve posture and alignment with patient demonstrating improved upright posture with posterior shoulder girdle engaged 03/06/18    Time  6    Period  Weeks    Status  New      PT LONG TERM GOAL #2   Title  Increase AROM Lt shoulder to within 5-10 degrees of AROM Rt shoulder 03/06/18    Time  6    Period  Weeks    Status  New      PT LONG TERM  GOAL #3   Title  Increase functional activities with patient using Lt UE to lift items such as groceries and clothing; reaching into closets/cabinets for light weight items with minimal to no pain 03/06/18    Time  6    Period  Weeks    Status  New      PT LONG TERM GOAL #4   Title  Independent in HEP 03/06/18    Time  6    Period  Weeks    Status  New      PT LONG TERM GOAL #5   Title  Improve FOTO to </= 37% limitation 03/06/18    Time  6    Period  Weeks    Status  New            Plan - 02/06/18 2956    Clinical Impression Statement  Patient reports that she continues to notice improved functional use Lt UE with less pain. Patient demonstrates improving scapular control and Lt shoulder ROM. She continues to have musular tightness and TP through the scapular and shoulder girdle area.     Rehab Potential  Good    PT Frequency  2x / week    PT Duration  6 weeks    PT Treatment/Interventions  Patient/family education;ADLs/Self Care Home Management;Cryotherapy;Electrical Stimulation;Iontophoresis 4mg /ml Dexamethasone;Moist Heat;Ultrasound;Dry needling;Manual techniques;Neuromuscular re-education;Therapeutic activities;Therapeutic exercise    PT Next Visit Plan  PROM initially, progressing ROM exercises.  DN/manual therapy as indicated.      Consulted and Agree with Plan of Care  Patient       Patient will benefit from skilled therapeutic intervention in order to improve the following deficits and impairments:  Postural dysfunction, Improper body mechanics, Pain, Increased fascial restricitons, Increased muscle spasms, Decreased mobility, Decreased range of motion, Decreased strength, Decreased activity tolerance  Visit Diagnosis: Acute pain of left shoulder  Other symptoms and signs involving the musculoskeletal system  Muscle weakness (generalized)  Abnormal posture     Problem List Patient Active Problem List   Diagnosis Date  Noted  . Herpes simplex type 1 infection  12/01/2013  . Cervical sympathetic dystrophy 12/01/2013  . FO (foramen ovale) 12/01/2013  . Renal insufficiency, mild 12/01/2013  . 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 CAFL (chronic airflow limitation) (HCC) 09/27/2013  . Urinary system disease 07/18/2013  . Bilateral polycystic ovarian syndrome 07/18/2013  . Polycystic ovaries 07/18/2013  . ASD (atrial septal defect), ostium secundum 07/18/2013    Celyn Rober Minion PT, MPH  02/06/2018, 8:06 AM  [facility] [address] [place], [place], [address] Phone: [phone]   Fax:  [phone]  Name: Mary Trujillo MRN: [id number] Date of Birth: 1956/01/12

## 2018-02-06 NOTE — Patient Instructions (Signed)
Pulley  10 sec hold x 10 reps  Keep shoulder down!!   Press down into bed with palm - keeping elbow bent and shoulder down    Resistive Band Rowing    With resistive band anchored in door, grasp both ends. Keeping elbows bent, pull back, squeezing shoulder blades together. Hold __2__ seconds. Repeat __10__ times. Do __1__ sessions per day.

## 2018-02-09 ENCOUNTER — Ambulatory Visit (INDEPENDENT_AMBULATORY_CARE_PROVIDER_SITE_OTHER): Payer: Commercial Managed Care - PPO | Admitting: Rehabilitative and Restorative Service Providers"

## 2018-02-09 ENCOUNTER — Encounter: Payer: Self-pay | Admitting: Rehabilitative and Restorative Service Providers"

## 2018-02-09 DIAGNOSIS — M6281 Muscle weakness (generalized): Secondary | ICD-10-CM | POA: Diagnosis not present

## 2018-02-09 DIAGNOSIS — M25512 Pain in left shoulder: Secondary | ICD-10-CM | POA: Diagnosis not present

## 2018-02-09 DIAGNOSIS — R29898 Other symptoms and signs involving the musculoskeletal system: Secondary | ICD-10-CM | POA: Diagnosis not present

## 2018-02-09 DIAGNOSIS — R293 Abnormal posture: Secondary | ICD-10-CM | POA: Diagnosis not present

## 2018-02-09 NOTE — Therapy (Signed)
[facility] [place] [address] [place], [place], [address] Phone: [phone]   Fax:  [phone]  Physical Therapy Treatment  Patient Details  Name: Mary Trujillo MRN: [id number] Date of Birth: [birth year] Referring Provider (PT): Dr Jene Every   Encounter Date: 02/09/2018  PT End of Session - 02/09/18 1618    Visit Number  6    Number of Visits  12    Date for PT Re-Evaluation  03/06/18    PT Start Time  1608    PT Stop Time  1709    PT Time Calculation (min)  61 min    Activity Tolerance  Patient tolerated treatment well       Past Medical History:  Diagnosis Date  . Abnormal pap 2005   CIN 1- Colpo CIN1, Neg ECC  . Ankle fracture   . Anxiety   . Colitis   . COPD (chronic obstructive pulmonary disease) (HCC) 4/15   had spirometry   . Hirsutism   . History of PCOS   . Hx of migraines   . Osteopenia     Past Surgical History:  Procedure Laterality Date  . APPENDECTOMY    . CATARACT EXTRACTION, BILATERAL Bilateral 04/2017  . PARTIAL COLECTOMY Right 12/2002   with appendectomy    There were no vitals filed for this visit.  Subjective Assessment - 02/09/18 1624    Subjective  Continued improvement with less pain and improved movement. Not sleeping well but this is related to recent deaths in the family and terminally ill sister in law.     Currently in Pain?  No/denies    Pain Score  0-No pain                       [facility] Adult PT Treatment/Exercise - 02/09/18 0001      Shoulder Exercises: Standing   Extension  Strengthening;Right;Left;10 reps;Theraband    Theraband Level (Shoulder Extension)  Level 2 (Red)    Row  Strengthening;Right;Left;10 reps;Theraband    Theraband Level (Shoulder Row)  Level 2 (Red)    Retraction  Strengthening;Right;Left;10 reps;Theraband    Theraband Level (Shoulder Retraction)  Level 1 (Yellow)      Shoulder Exercises: Pulleys   Flexion  --   10 sec hold  x 10    Scaption  --   10 sec hold x 10 reps    Other Pulley Exercises  IR in standing 10 sec x 6 reps to pt tolerance       Shoulder Exercises: Therapy Ball   Other Therapy Ball Exercises  scapular depression 5 sec x 10     Other Therapy Ball Exercises  holding 8 in ball btn hands for active motions in multiple planes with focus on scapular control       Shoulder Exercises: ROM/Strengthening   Pendulum  20 CW/20CCW       Shoulder Exercises: Stretch   External Rotation Stretch Limitations  hands behind head for ER stretch 20-30 sec x 3 reps PT assist       Moist Heat Therapy   Number Minutes Moist Heat  15 Minutes    Moist Heat Location  Shoulder;Cervical   Lt; thoracic spine     Electrical Stimulation   Electrical Stimulation Location  Lt shoulder girdle     Electrical Stimulation Action  IFC    Electrical Stimulation Parameters  to tolerance    Electrical Stimulation Goals  Pain;Tone  Manual Therapy   Manual therapy comments  Pt supine, with LUE supported.     Joint Mobilization  circumduction Lt GH joint     Soft tissue mobilization  STM to pt tolerance through Lt shoulder girdle - upper trap; leveator; medial scapular border; biceps; teres; lats/lateral scap border; deltoid     Myofascial Release  upper trap; pec     Passive ROM  PROM and end range stretch Lt GH joint into flexion; ER/IR in scapular plane with gentle distraction of joint              PT Education - 02/09/18 1643    Education Details  HEP     Person(s) Educated  Patient    Methods  Explanation;Demonstration;Tactile cues;Verbal cues;Handout    Comprehension  Verbalized understanding;Returned demonstration;Verbal cues required;Tactile cues required          PT Long Term Goals - 01/23/18 1714      PT LONG TERM GOAL #1   Title  Improve posture and alignment with patient demonstrating improved upright posture with posterior shoulder girdle engaged 03/06/18    Time  6    Period  Weeks     Status  New      PT LONG TERM GOAL #2   Title  Increase AROM Lt shoulder to within 5-10 degrees of AROM Rt shoulder 03/06/18    Time  6    Period  Weeks    Status  New      PT LONG TERM GOAL #3   Title  Increase functional activities with patient using Lt UE to lift items such as groceries and clothing; reaching into closets/cabinets for light weight items with minimal to no pain 03/06/18    Time  6    Period  Weeks    Status  New      PT LONG TERM GOAL #4   Title  Independent in HEP 03/06/18    Time  6    Period  Weeks    Status  New      PT LONG TERM GOAL #5   Title  Improve FOTO to </= 37% limitation 03/06/18    Time  6    Period  Weeks    Status  New            Plan - 02/09/18 1619    Clinical Impression Statement  Continued improvement in shoulder pain and mobility. Working on her exercises at home. Not sleeping well but this is not related to the shoulder - rather personal family ilness and loss.  Patient demonstrates increaing Lt shoulder ROM with improving scapular control/body mechanics. Patient has less palpable tightness Lt shoulder girdle; Progressing well with shoulder rehab.     Rehab Potential  Good    PT Frequency  2x / week    PT Duration  6 weeks    PT Treatment/Interventions  Patient/family education;ADLs/Self Care Home Management;Cryotherapy;Electrical Stimulation;Iontophoresis 4mg /ml Dexamethasone;Moist Heat;Ultrasound;Dry needling;Manual techniques;Neuromuscular re-education;Therapeutic activities;Therapeutic exercise    PT Next Visit Plan  PROM initially, progressing ROM exercises.  DN/manual therapy as indicated.      Consulted and Agree with Plan of Care  Patient       Patient will benefit from skilled therapeutic intervention in order to improve the following deficits and impairments:  Postural dysfunction, Improper body mechanics, Pain, Increased fascial restricitons, Increased muscle spasms, Decreased mobility, Decreased range of motion, Decreased  strength, Decreased activity tolerance  Visit Diagnosis: Acute pain of left shoulder  Other  symptoms and signs involving the musculoskeletal system  Muscle weakness (generalized)  Abnormal posture     Problem List Patient Active Problem List   Diagnosis Date Noted  . Herpes simplex type 1 infection 12/01/2013  . Cervical sympathetic dystrophy 12/01/2013  . FO (foramen ovale) 12/01/2013  . Renal insufficiency, mild 12/01/2013  . 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 CAFL (chronic airflow limitation) (HCC) 09/27/2013  . Urinary system disease 07/18/2013  . Bilateral polycystic ovarian syndrome 07/18/2013  . Polycystic ovaries 07/18/2013  . ASD (atrial septal defect), ostium secundum 07/18/2013    Jameison Haji Rober Minion PT, MPH  02/09/2018, 5:15 PM  [facility] [address] [place], [place], [address] Phone: [phone]   Fax:  [phone]  Name: Mary Trujillo MRN: [id number] Date of Birth: [birth year]

## 2018-02-09 NOTE — Patient Instructions (Signed)
Resisted External Rotation: in Neutral - Bilateral   PALMS UP Sit or stand, tubing in both hands, elbows at sides, bent to 90, forearms forward. Pinch shoulder blades together and rotate forearms out. Keep elbows at sides. Repeat __10__ times per set. Do _2-3___ sets per session. Do _2-3___ sessions per day.   Low Row: Standing   Face anchor, feet shoulder width apart. Palms up, pull arms back, squeezing shoulder blades down and back. Repeat 10__ times per set. Do 2-3__ sets per session. Do 1 time/day    Strengthening: Resisted Extension   Hold tubing in right hand, arm forward. Pull arm back, elbow straight. Repeat _10___ times per set. Do 2-3____ sets per session. Do 1__ sessions per day.  Using a 10 inch plastic ball  Holding ball between hands - moving ball in various directions  Move slowly

## 2018-02-13 ENCOUNTER — Ambulatory Visit (INDEPENDENT_AMBULATORY_CARE_PROVIDER_SITE_OTHER): Payer: Commercial Managed Care - PPO | Admitting: Rehabilitative and Restorative Service Providers"

## 2018-02-13 ENCOUNTER — Encounter: Payer: Self-pay | Admitting: Rehabilitative and Restorative Service Providers"

## 2018-02-13 DIAGNOSIS — R29898 Other symptoms and signs involving the musculoskeletal system: Secondary | ICD-10-CM

## 2018-02-13 DIAGNOSIS — R293 Abnormal posture: Secondary | ICD-10-CM

## 2018-02-13 DIAGNOSIS — M25512 Pain in left shoulder: Secondary | ICD-10-CM | POA: Diagnosis not present

## 2018-02-13 DIAGNOSIS — M6281 Muscle weakness (generalized): Secondary | ICD-10-CM | POA: Diagnosis not present

## 2018-02-13 NOTE — Therapy (Signed)
[facility] [place] [address] [place], [place], [address] Phone: [phone]   Fax:  [phone]  Physical Therapy Treatment  Patient Details  Name: Mary Trujillo MRN: [id number] Date of Birth: 1956/02/10 Referring Provider (PT): Dr Jene Every   Encounter Date: 02/13/2018  PT End of Session - 02/13/18 0721    Visit Number  7    Number of Visits  12    Date for PT Re-Evaluation  03/06/18    PT Start Time  0719    PT Stop Time  0800    PT Time Calculation (min)  41 min    Activity Tolerance  Patient tolerated treatment well       Past Medical History:  Diagnosis Date  . Abnormal pap 2005   CIN 1- Colpo CIN1, Neg ECC  . Ankle fracture   . Anxiety   . Colitis   . COPD (chronic obstructive pulmonary disease) (HCC) 4/15   had spirometry   . Hirsutism   . History of PCOS   . Hx of migraines   . Osteopenia     Past Surgical History:  Procedure Laterality Date  . APPENDECTOMY    . CATARACT EXTRACTION, BILATERAL Bilateral 04/2017  . PARTIAL COLECTOMY Right 12/2002   with appendectomy    There were no vitals filed for this visit.  Subjective Assessment - 02/13/18 0724    Subjective  No pain - held her 22 pound grandson this weekend. Even leaned onto the Lt shoulder and did not experience pain.     Currently in Pain?  No/denies                       [facility] Adult PT Treatment/Exercise - 02/13/18 0001      Shoulder Exercises: Standing   Extension  Strengthening;Right;Left;10 reps;Theraband    Theraband Level (Shoulder Extension)  Level 3 (Green)    Row  Strengthening;Right;Left;10 reps;Theraband    Theraband Level (Shoulder Row)  Level 3 (Green)    Row Limitations  bow and arrow x 10 reps each side       Shoulder Exercises: Pulleys   Flexion  --   10 sec hold x 10    Scaption  --   10 sec hold x 10 reps      Shoulder Exercises: Therapy Ball   Other Therapy Ball Exercises  scapular  depression 5 sec x 10; holding ball while PT moves in various directions     Other Therapy Ball Exercises  holding 8 in ball btn hands for active motions in multiple planes with focus on scapular control       Shoulder Exercises: Stretch   Corner Stretch  3 reps;30 seconds    External Rotation Stretch  3 reps;30 seconds   hand a doorway - turning to Rt    Other Shoulder Stretches  horizontal abduction stretch 30 sec x 3       Manual Therapy   Manual therapy comments  Pt supine, with LUE supported.     Joint Mobilization  circumduction Lt GH joint     Soft tissue mobilization  STM to pt tolerance through Lt shoulder girdle - upper trap; leveator; medial scapular border; biceps; teres; lats/lateral scap border; deltoid     Myofascial Release  upper trap; pec     Passive ROM  PROM and end range stretch Lt GH joint into flexion; ER/IR in scapular plane with gentle distraction of joint  PT Education - 02/13/18 0747    Education Details  HEP     Person(s) Educated  Patient    Methods  Explanation;Demonstration;Tactile cues;Verbal cues;Handout    Comprehension  Verbalized understanding;Returned demonstration;Verbal cues required          PT Long Term Goals - 01/23/18 1714      PT LONG TERM GOAL #1   Title  Improve posture and alignment with patient demonstrating improved upright posture with posterior shoulder girdle engaged 03/06/18    Time  6    Period  Weeks    Status  New      PT LONG TERM GOAL #2   Title  Increase AROM Lt shoulder to within 5-10 degrees of AROM Rt shoulder 03/06/18    Time  6    Period  Weeks    Status  New      PT LONG TERM GOAL #3   Title  Increase functional activities with patient using Lt UE to lift items such as groceries and clothing; reaching into closets/cabinets for light weight items with minimal to no pain 03/06/18    Time  6    Period  Weeks    Status  New      PT LONG TERM GOAL #4   Title  Independent in HEP 03/06/18     Time  6    Period  Weeks    Status  New      PT LONG TERM GOAL #5   Title  Improve FOTO to </= 37% limitation 03/06/18    Time  6    Period  Weeks    Status  New            Plan - 02/13/18 1610    Clinical Impression Statement  Progressing well with increased functional activities and decreased pain Lt shoulder. Adding resistive exercises.     Rehab Potential  Good    PT Frequency  2x / week    PT Duration  6 weeks    PT Treatment/Interventions  Patient/family education;ADLs/Self Care Home Management;Cryotherapy;Electrical Stimulation;Iontophoresis 4mg /ml Dexamethasone;Moist Heat;Ultrasound;Dry needling;Manual techniques;Neuromuscular re-education;Therapeutic activities;Therapeutic exercise    PT Next Visit Plan  PROM initially, progressing ROM exercises.  DN/manual therapy as indicated.      Consulted and Agree with Plan of Care  Patient       Patient will benefit from skilled therapeutic intervention in order to improve the following deficits and impairments:  Postural dysfunction, Improper body mechanics, Pain, Increased fascial restricitons, Increased muscle spasms, Decreased mobility, Decreased range of motion, Decreased strength, Decreased activity tolerance  Visit Diagnosis: Acute pain of left shoulder  Other symptoms and signs involving the musculoskeletal system  Muscle weakness (generalized)  Abnormal posture     Problem List Patient Active Problem List   Diagnosis Date Noted  . Herpes simplex type 1 infection 12/01/2013  . Cervical sympathetic dystrophy 12/01/2013  . FO (foramen ovale) 12/01/2013  . Renal insufficiency, mild 12/01/2013  . 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 CAFL (chronic airflow limitation) (HCC) 09/27/2013  . Urinary system disease 07/18/2013  . Bilateral polycystic ovarian syndrome 07/18/2013  . Polycystic ovaries 07/18/2013  . ASD (atrial septal defect), ostium secundum 07/18/2013    Wafa Martes Rober Minion PT, MPH  02/13/2018, 8:03 AM  [facility] [address] [place], [place], [address] Phone: [phone]   Fax:  [phone]  Name: Mary Trujillo MRN: [id number] Date of Birth: [birth year]

## 2018-02-13 NOTE — Patient Instructions (Signed)
Flexibility: Corner Stretch    Standing in corner with hands just above shoulder level and feet ____ inches from corner, step one foot forward until a comfortable stretch is felt across chest. Hold __30__ seconds. Repeat __3__ times per set.  Do __2-3__ sessions per day.  Arm out to side at 90 degrees, elbow straight facing wall, turn to the right   Stretch 30 sec x 3   Standing with palm at doorway turn to right  Hold 30 sec x 3   Bow and arrow Red band and progress ot green as tolerated  Pulling band across chest as you step back

## 2018-02-16 ENCOUNTER — Ambulatory Visit (INDEPENDENT_AMBULATORY_CARE_PROVIDER_SITE_OTHER): Payer: Commercial Managed Care - PPO | Admitting: Physical Therapy

## 2018-02-16 DIAGNOSIS — M25512 Pain in left shoulder: Secondary | ICD-10-CM

## 2018-02-16 DIAGNOSIS — M6281 Muscle weakness (generalized): Secondary | ICD-10-CM | POA: Diagnosis not present

## 2018-02-16 DIAGNOSIS — R29898 Other symptoms and signs involving the musculoskeletal system: Secondary | ICD-10-CM | POA: Diagnosis not present

## 2018-02-16 DIAGNOSIS — R293 Abnormal posture: Secondary | ICD-10-CM

## 2018-02-16 NOTE — Therapy (Signed)
[facility] [place], [place], [address] Phone: [phone]   Fax:  [phone]  Physical Therapy Treatment  Patient Details  Name: Mary Trujillo MRN: [id number] Date of Birth: 07/08/1955 Referring Provider (PT): Dr Susa Day   Encounter Date: 02/16/2018  PT End of Session - 02/16/18 1623    Visit Number  8    Number of Visits  12    Date for PT Re-Evaluation  03/06/18    PT Start Time  1620   pt arrived late   PT Stop Time  1705    PT Time Calculation (min)  45 min    Activity Tolerance  Patient tolerated treatment well;No increased pain    Behavior During Therapy  WFL for tasks assessed/performed       Past Medical History:  Diagnosis Date  . Abnormal pap 2005   CIN 1- Colpo CIN1, Neg ECC  . Ankle fracture   . Anxiety   . Colitis   . COPD (chronic obstructive pulmonary disease) ([facility]) 4/15   had spirometry   . Hirsutism   . History of PCOS   . Hx of migraines   . Osteopenia     Past Surgical History:  Procedure Laterality Date  . APPENDECTOMY    . CATARACT EXTRACTION, BILATERAL Bilateral 04/2017  . PARTIAL COLECTOMY Right 12/2002   with appendectomy    There were no vitals filed for this visit.  Subjective Assessment - 02/16/18 1623    Subjective  Pt realized she was able to put dishes away with Lt arm overhead.  "I've been bad.  I haven't had a chance to do any exercises this week".     Patient Stated Goals  to get back to normal and return to skating     Currently in Pain?  No/denies    Pain Score  0-No pain         [facility] PT Assessment - 02/16/18 0001      Assessment   Medical Diagnosis  Fracture Lt proximal humerus    Referring Provider (PT)  Dr Susa Day    Onset Date/Surgical Date  12/08/17    Hand Dominance  Right    Next MD Visit  PRN      AROM   Left Shoulder Extension  41 Degrees    Left Shoulder Flexion  125 Degrees    Left Shoulder ABduction  115  Degrees    Left Shoulder Internal Rotation  --   Lt thumb to bra strap   Left Shoulder External Rotation  63 Degrees   supine, abdct ~70 deg      [facility] Adult PT Treatment/Exercise - 02/16/18 0001      Exercises   Exercises  Shoulder      Shoulder Exercises: Supine   Other Supine Exercises  elbow press, hands behind head x 30 sec x 3 reps      Shoulder Exercises: Standing   Row  Strengthening;Both;10 reps;Theraband    Theraband Level (Shoulder Row)  Level 3 (Green)    Row Limitations  bow and arrow with green band x 10 reps each side (cues for form)- multiple cues on form.     Retraction  Strengthening;Right;Left;10 reps;Theraband    Theraband Level (Shoulder Retraction)  Level 1 (Yellow)    Other Standing Exercises  rings on medium hoop (on back of truck/shelf) x 12 rings Rt/Lt; repeated with high hoop (~120 deg flexion at top)    Other Standing Exercises  Lt shoulder flexion to 90 and 120 deg with 2# wt lifting it to shelf in front of her and slowly lowering it to counter x 5 reps       Shoulder Exercises: Pulleys   Flexion  --   10 sec hold x 10    Scaption  --   10 sec hold x 10 reps      Shoulder Exercises: ROM/Strengthening   Pendulum  10 cw/ccw after horiz add  stretch      Shoulder Exercises: Stretch   Cross Chest Stretch  1 rep;20 seconds    Cross Chest Stretch Limitations  painful after 20 sec; performed pendulum to eased pain    Other Shoulder Stretches  biceps stretch 30 sec x 3 standing     Other Shoulder Stretches  unilateral midlevel doorway stretch x 2 reps       Modalities   Modalities  --   pt declined     Manual Therapy   Soft tissue mobilization  IASTM to Lt periscapular musculature, Lt distal pec and prox bicep, and infraspinatus/teres minor to decrease fascial tightness and improve ROM.                    PT Long Term Goals - 02/16/18 1737      PT LONG TERM GOAL #1   Title  Improve posture and alignment with patient demonstrating  improved upright posture with posterior shoulder girdle engaged 03/06/18    Time  6    Period  Weeks    Status  On-going      PT LONG TERM GOAL #2   Title  Increase AROM Lt shoulder to within 5-10 degrees of AROM Rt shoulder 03/06/18    Time  6    Period  Weeks    Status  Partially Met      PT LONG TERM GOAL #3   Title  Increase functional activities with patient using Lt UE to lift items such as groceries and clothing; reaching into closets/cabinets for light weight items with minimal to no pain 03/06/18    Time  6    Period  Weeks    Status  Partially Met      PT LONG TERM GOAL #4   Title  Independent in HEP 03/06/18    Time  6    Period  Weeks    Status  On-going      PT LONG TERM GOAL #5   Title  Improve FOTO to </= 37% limitation 03/06/18    Time  6    Period  Weeks    Status  On-going            Plan - 02/16/18 1705    Clinical Impression Statement  Pt's Lt shoulder ROM progressing nicely, especially IR/ER.  She tolerated all resistive and ROM exercises well, without increase in pain. Pt reporting increased functional mobility with LUE; has partially met LTG#3.  Pt had tightness/tenderness in Lt teres minor, pec major and levator with IASTM.  Encouraged pt to perform self massage to these areas at home.      Rehab Potential  Good    Clinical Impairments Affecting Rehab Potential  forward posture; abnormal movement patterns Lt UE; muscular imbalance; muscular tightness     PT Frequency  2x / week    PT Duration  6 weeks    PT Treatment/Interventions  Patient/family education;ADLs/Self Care Home Management;Cryotherapy;Electrical Stimulation;Iontophoresis '4mg'$ /ml Dexamethasone;Moist Heat;Ultrasound;Dry needling;Manual techniques;Neuromuscular re-education;Therapeutic activities;Therapeutic exercise  PT Next Visit Plan  continue progressive ROM/ strengthening exercises for Lt shoulder.      Consulted and Agree with Plan of Care  Patient       Patient will benefit from  skilled therapeutic intervention in order to improve the following deficits and impairments:  Postural dysfunction, Improper body mechanics, Pain, Increased fascial restricitons, Increased muscle spasms, Decreased mobility, Decreased range of motion, Decreased strength, Decreased activity tolerance  Visit Diagnosis: Acute pain of left shoulder  Other symptoms and signs involving the musculoskeletal system  Muscle weakness (generalized)  Abnormal posture     Problem List Patient Active Problem List   Diagnosis Date Noted  . Herpes simplex type 1 infection 12/01/2013  . Cervical sympathetic dystrophy 12/01/2013  . FO (foramen ovale) 12/01/2013  . Renal insufficiency, mild 12/01/2013  . 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 CAFL (chronic airflow limitation) ([place]) 09/27/2013  . Urinary system disease 07/18/2013  . Bilateral polycystic ovarian syndrome 07/18/2013  . Polycystic ovaries 07/18/2013  . ASD (atrial septal defect), ostium secundum 07/18/2013   Kerin Perna, PTA 02/16/18 5:38 PM  [facility] [place], [place], [address] Phone: [phone]   Fax:  [phone]  Name: Mary Trujillo MRN: [id number] Date of Birth: [birth year]

## 2018-02-20 ENCOUNTER — Ambulatory Visit (INDEPENDENT_AMBULATORY_CARE_PROVIDER_SITE_OTHER): Payer: Commercial Managed Care - PPO | Admitting: Physical Therapy

## 2018-02-20 ENCOUNTER — Encounter: Payer: Self-pay | Admitting: Physical Therapy

## 2018-02-20 DIAGNOSIS — R293 Abnormal posture: Secondary | ICD-10-CM

## 2018-02-20 DIAGNOSIS — M25512 Pain in left shoulder: Secondary | ICD-10-CM | POA: Diagnosis not present

## 2018-02-20 DIAGNOSIS — R29898 Other symptoms and signs involving the musculoskeletal system: Secondary | ICD-10-CM | POA: Diagnosis not present

## 2018-02-20 DIAGNOSIS — M6281 Muscle weakness (generalized): Secondary | ICD-10-CM | POA: Diagnosis not present

## 2018-02-20 NOTE — Patient Instructions (Addendum)
Over Head Pull: Narrow Grip     [place] [id number]   On back, knees bent, feet flat, band across thighs, elbows straight but relaxed. Pull hands apart (start). Keeping elbows straight, bring arms up and over head, hands toward floor. Keep pull steady on band. Hold momentarily. Return slowly, keeping pull steady, back to start. Repeat _10__ times. Band color ___red or yellow___ , 2 sets   Side Pull: Double Arm   On back, knees bent, feet flat. Arms perpendicular to body, shoulder level, elbows straight but relaxed. Pull arms out to sides, elbows straight. Resistance band comes across collarbones, hands toward floor. Hold momentarily. Slowly return to starting position. Repeat _10__ times. Band color __red ___ , 2 sets  Sash   On back, knees bent, feet flat, left hand on left hip, right hand above left. Pull right arm DIAGONALLY (hip to shoulder) across chest. Bring right arm along head toward floor. Hold momentarily. Slowly return to starting position. Repeat __10_ times. Do with left arm. Band color __yellow____, 2 sets   Shoulder Rotation: Double Arm   On back, knees bent, feet flat, elbows tucked at sides, bent 90, hands palms up. Pull hands apart and down toward floor, keeping elbows near sides. Hold momentarily. Slowly return to starting position. Repeat __10_ times. Band color __yellow____ , 2 sets  Kinesiology tape What is kinesiology tape?  There are many brands of kinesiology tape.  KTape, [employer], [employer], Dynamic tape, to name a few. It is an elasticized tape designed to support the body's natural healing process. This tape provides stability and support to muscles and joints without restricting motion. It can also help decrease swelling in the area of application. How does it work? The tape microscopically lifts and decompresses the skin to allow for drainage of lymph (swelling) to flow away from area, reducing inflammation.  The tape has the ability to help re-educate the  neuromuscular system by targeting specific receptors in the skin.  The presence of the tape increases the body's awareness of posture and body mechanics.  Do not use with: . Open wounds . Skin lesions . Adhesive allergies Safe removal of the tape: In some rare cases, mild/moderate skin irritation can occur.  This can include redness, itchiness, or hives. If this occurs, immediately remove tape and consult your primary care physician if symptoms are severe or do not resolve within 2 days.  To remove tape safely, hold nearby skin with one hand and gentle roll tape down with other hand.  You can apply oil or conditioner to tape while in shower prior to removal to loosen adhesive.  DO NOT swiftly rip tape off like a band-aid, as this could cause skin tears and additional skin irritation.

## 2018-02-20 NOTE — Therapy (Signed)
[facility] [place], [place], [address] Phone: [phone]   Fax:  [phone]  Physical Therapy Treatment  Patient Details  Name: Mary Trujillo MRN: [id number] Date of Birth: 04-11-1956 Referring Provider (PT): Dr Susa Day   Encounter Date: 02/20/2018  PT End of Session - 02/20/18 1611    Visit Number  9    Number of Visits  12    Date for PT Re-Evaluation  03/06/18    PT Start Time  4196    PT Stop Time  1655    PT Time Calculation (min)  48 min    Activity Tolerance  Patient tolerated treatment well    Behavior During Therapy  [facility] for tasks assessed/performed       Past Medical History:  Diagnosis Date  . Abnormal pap 2005   CIN 1- Colpo CIN1, Neg ECC  . Ankle fracture   . Anxiety   . Colitis   . COPD (chronic obstructive pulmonary disease) ([place]) 4/15   had spirometry   . Hirsutism   . History of PCOS   . Hx of migraines   . Osteopenia     Past Surgical History:  Procedure Laterality Date  . APPENDECTOMY    . CATARACT EXTRACTION, BILATERAL Bilateral 04/2017  . PARTIAL COLECTOMY Right 12/2002   with appendectomy    There were no vitals filed for this visit.  Subjective Assessment - 02/20/18 1611    Subjective  "I was a little achy after last session.  But I feel like I am able to do more"    Currently in Pain?  Yes    Pain Score  1     Pain Location  Shoulder    Pain Orientation  Left    Aggravating Factors   reaching over head, cross chest    Pain Relieving Factors  heat, ice         [facility] PT Assessment - 02/20/18 0001      Assessment   Medical Diagnosis  Fracture Lt proximal humerus    Referring Provider (PT)  Dr Susa Day    Onset Date/Surgical Date  12/08/17    Hand Dominance  Right    Next MD Visit  PRN      AROM   Left Shoulder Flexion  130 Degrees        [facility] Adult PT Treatment/Exercise - 02/20/18 0001      Exercises   Exercises  Shoulder       Shoulder Exercises: Supine   Horizontal ABduction  Strengthening;Both;10 reps;Theraband    Theraband Level (Shoulder Horizontal ABduction)  Level 2 (Red)    Horizontal ABduction Limitations  2nd set 5 reps    External Rotation  Strengthening;Both;10 reps;Theraband    Theraband Level (Shoulder External Rotation)  Level 1 (Yellow)    External Rotation Limitations  2nd set 5 reps    Flexion  Strengthening;Both;10 reps;Theraband    Theraband Level (Shoulder Flexion)  Level 2 (Red)    Flexion Limitations  2nd set 5 reps    Other Supine Exercises  elbow press, hands behind head x 30 sec x 3 reps    Other Supine Exercises  sash RUE with yellow band  x  10 reps, 3 sec pause, 2nd set 5 reps with less discomfort.       Shoulder Exercises: Pulleys   Flexion  --   10 sec hold x 10    Scaption  --   10 sec  hold x 10 reps      Shoulder Exercises: ROM/Strengthening   Wall Pushups  10 reps    Wall Pushups Limitations  reverse wall push ups x 10    Pendulum  10 cw/ccw after reverse push up.       Moist Heat Therapy   Number Minutes Moist Heat  10 Minutes    Moist Heat Location  Shoulder      Electrical Stimulation   Electrical Stimulation Location  Lt shoulder girdle     Electrical Stimulation Action  IFC    Electrical Stimulation Parameters  to tolerance    Electrical Stimulation Goals  Pain      Manual Therapy   Manual Therapy  Taping;Soft tissue mobilization    Soft tissue mobilization  IASTM to Lt periscapular musculature, Lt distal pec and prox bicep, and infraspinatus/teres minor to decrease fascial tightness and improve ROM.      Kinesiotex  Facilitate Muscle      Kinesiotix   Facilitate Muscle   I strip of reg rock tape applied to ant and post deltoid to assist in facitation of muscle;  perpendicular strip applied at teres minor to decompress tissue and pain.                PT Education - 02/20/18 1633    Education Details  HEP, ktape info     Person(s) Educated  Patient     Methods  Explanation;Handout;Demonstration;Verbal cues    Comprehension  Verbalized understanding;Returned demonstration          PT Long Term Goals - 02/16/18 1737      PT LONG TERM GOAL #1   Title  Improve posture and alignment with patient demonstrating improved upright posture with posterior shoulder girdle engaged 03/06/18    Time  6    Period  Weeks    Status  On-going      PT LONG TERM GOAL #2   Title  Increase AROM Lt shoulder to within 5-10 degrees of AROM Rt shoulder 03/06/18    Time  6    Period  Weeks    Status  Partially Met      PT LONG TERM GOAL #3   Title  Increase functional activities with patient using Lt UE to lift items such as groceries and clothing; reaching into closets/cabinets for light weight items with minimal to no pain 03/06/18    Time  6    Period  Weeks    Status  Partially Met      PT LONG TERM GOAL #4   Title  Independent in HEP 03/06/18    Time  6    Period  Weeks    Status  On-going      PT LONG TERM GOAL #5   Title  Improve FOTO to </= 37% limitation 03/06/18    Time  6    Period  Weeks    Status  On-going            Plan - 02/20/18 1644    Clinical Impression Statement  Pt tolerated new exercises well, reporting good stretch in shoulder in supine.  Pt had some spasming of Lt shoulder with reverse wall push ups.  She was very tender with palpation to Lt tricep and posterior shoulder girdle; slightly improved with IASTM.  Pt reported reduction of discomfort with use of MHP at end of session.  Progressing well towards goals.     Rehab Potential  Good  Clinical Impairments Affecting Rehab Potential  forward posture; abnormal movement patterns Lt UE; muscular imbalance; muscular tightness     PT Frequency  2x / week    PT Duration  6 weeks    PT Treatment/Interventions  Patient/family education;ADLs/Self Care Home Management;Cryotherapy;Electrical Stimulation;Iontophoresis 72m/ml Dexamethasone;Moist Heat;Ultrasound;Dry  needling;Manual techniques;Neuromuscular re-education;Therapeutic activities;Therapeutic exercise    PT Next Visit Plan  continue progressive ROM/ strengthening exercises for Lt shoulder.  assess response to [employer] Agree with Plan of Care  Patient       Patient will benefit from skilled therapeutic intervention in order to improve the following deficits and impairments:  Postural dysfunction, Improper body mechanics, Pain, Increased fascial restricitons, Increased muscle spasms, Decreased mobility, Decreased range of motion, Decreased strength, Decreased activity tolerance  Visit Diagnosis: Acute pain of left shoulder  Other symptoms and signs involving the musculoskeletal system  Muscle weakness (generalized)  Abnormal posture     Problem List Patient Active Problem List   Diagnosis Date Noted  . Herpes simplex type 1 infection 12/01/2013  . Cervical sympathetic dystrophy 12/01/2013  . FO (foramen ovale) 12/01/2013  . Renal insufficiency, mild 12/01/2013  . 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 [place] 09/27/2013  . Urinary system disease 07/18/2013  . Bilateral polycystic ovarian syndrome 07/18/2013  . Polycystic ovaries 07/18/2013  . ASD (atrial septal defect), ostium secundum 07/18/2013   JKerin Perna PTA 02/20/18 4:58 PM  [facility] [address]Phone: [phone]  Fax:  [phone] Name: Mary BARCUSMRN: [id number] of Birth: [birth year]

## 2018-02-22 ENCOUNTER — Ambulatory Visit (INDEPENDENT_AMBULATORY_CARE_PROVIDER_SITE_OTHER): Payer: Commercial Managed Care - PPO | Admitting: Rehabilitative and Restorative Service Providers"

## 2018-02-22 ENCOUNTER — Encounter: Payer: Self-pay | Admitting: Rehabilitative and Restorative Service Providers"

## 2018-02-22 DIAGNOSIS — M25512 Pain in left shoulder: Secondary | ICD-10-CM

## 2018-02-22 DIAGNOSIS — R293 Abnormal posture: Secondary | ICD-10-CM | POA: Diagnosis not present

## 2018-02-22 DIAGNOSIS — R29898 Other symptoms and signs involving the musculoskeletal system: Secondary | ICD-10-CM

## 2018-02-22 DIAGNOSIS — M6281 Muscle weakness (generalized): Secondary | ICD-10-CM

## 2018-02-22 NOTE — Therapy (Signed)
[facility] [place], [place], [address] Phone: [phone]   Fax:  [phone]  Physical Therapy Treatment  Patient Details  Name: Mary Trujillo MRN: [id number] Date of Birth: 1956/01/12 Referring Provider (PT): Dr Susa Day   Encounter Date: 02/22/2018  PT End of Session - 02/22/18 1609    Visit Number  10    Number of Visits  12    Date for PT Re-Evaluation  03/06/18    PT Start Time  1603    PT Stop Time  1657    PT Time Calculation (min)  54 min    Activity Tolerance  Patient tolerated treatment well       Past Medical History:  Diagnosis Date  . Abnormal pap 2005   CIN 1- Colpo CIN1, Neg ECC  . Ankle fracture   . Anxiety   . Colitis   . COPD (chronic obstructive pulmonary disease) ([place]) 4/15   had spirometry   . Hirsutism   . History of PCOS   . Hx of migraines   . Osteopenia     Past Surgical History:  Procedure Laterality Date  . APPENDECTOMY    . CATARACT EXTRACTION, BILATERAL Bilateral 04/2017  . PARTIAL COLECTOMY Right 12/2002   with appendectomy    There were no vitals filed for this visit.  Subjective Assessment - 02/22/18 1610    Subjective  Improving - gaining more movement and function but gets a "twinge" at odd times during the day - usually 4 times a day. Still can't lift arm up overhead like the other arm.                         [facility] Adult PT Treatment/Exercise - 02/22/18 0001      Shoulder Exercises: Pulleys   Flexion  --   10 sec hold x 10    Scaption  --   10 sec hold x 10 reps      Shoulder Exercises: Stretch   External Rotation Stretch  3 reps;30 seconds   sitting with good posture scap down and back    Other Shoulder Stretches  3 way doorway 30 sec x 2 each position     Other Shoulder Stretches  lateral trunk flexion reaching       Moist Heat Therapy   Number Minutes Moist Heat  14 Minutes    Moist Heat Location  Shoulder      Electrical Stimulation   Electrical Stimulation Location  Lt shoulder girdle     Electrical Stimulation Action  IFC    Electrical Stimulation Parameters  to tolerance    Electrical Stimulation Goals  Pain;Tone      Manual Therapy   Manual therapy comments  Pt supine, with LUE supported.     Joint Mobilization  circumduction Lt GH joint     Soft tissue mobilization  deep tissue work through Lt shoulder girdle    Myofascial Release  lateral scapular border     Passive ROM  PROM and end range stretch Lt GH joint into flexion; ER/IR in scapular plane with gentle distraction of joint              PT Education - 02/22/18 1625    Education Details  HEP     Person(s) Educated  Patient    Methods  Explanation;Demonstration;Tactile cues;Verbal cues;Handout    Comprehension  Verbalized understanding;Returned demonstration;Verbal cues required;Tactile cues required  PT Long Term Goals - 02/16/18 1737      PT LONG TERM GOAL #1   Title  Improve posture and alignment with patient demonstrating improved upright posture with posterior shoulder girdle engaged 03/06/18    Time  6    Period  Weeks    Status  On-going      PT LONG TERM GOAL #2   Title  Increase AROM Lt shoulder to within 5-10 degrees of AROM Rt shoulder 03/06/18    Time  6    Period  Weeks    Status  Partially Met      PT LONG TERM GOAL #3   Title  Increase functional activities with patient using Lt UE to lift items such as groceries and clothing; reaching into closets/cabinets for light weight items with minimal to no pain 03/06/18    Time  6    Period  Weeks    Status  Partially Met      PT LONG TERM GOAL #4   Title  Independent in HEP 03/06/18    Time  6    Period  Weeks    Status  On-going      PT LONG TERM GOAL #5   Title  Improve FOTO to </= 37% limitation 03/06/18    Time  6    Period  Weeks    Status  On-going            Plan - 02/22/18 1610    Clinical Impression Statement  Progressing  well with shoulder ROM and functional activity level. Good response to manual work and PROM.      Rehab Potential  Good    Clinical Impairments Affecting Rehab Potential  forward posture; abnormal movement patterns Lt UE; muscular imbalance; muscular tightness     PT Frequency  2x / week    PT Duration  6 weeks    PT Treatment/Interventions  Patient/family education;ADLs/Self Care Home Management;Cryotherapy;Electrical Stimulation;Iontophoresis '4mg'$ /ml Dexamethasone;Moist Heat;Ultrasound;Dry needling;Manual techniques;Neuromuscular re-education;Therapeutic activities;Therapeutic exercise    PT Next Visit Plan  continue progressive ROM/ strengthening exercises for Lt shoulder.  assess response to [employer] and Agree with Plan of Care  Patient       Patient will benefit from skilled therapeutic intervention in order to improve the following deficits and impairments:  Postural dysfunction, Improper body mechanics, Pain, Increased fascial restricitons, Increased muscle spasms, Decreased mobility, Decreased range of motion, Decreased strength, Decreased activity tolerance  Visit Diagnosis: Acute pain of left shoulder  Other symptoms and signs involving the musculoskeletal system  Muscle weakness (generalized)  Abnormal posture     Problem List Patient Active Problem List   Diagnosis Date Noted  . Herpes simplex type 1 infection 12/01/2013  . Cervical sympathetic dystrophy 12/01/2013  . FO (foramen ovale) 12/01/2013  . Renal insufficiency, mild 12/01/2013  . 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 CAFL (chronic airflow limitation) ([place]) 09/27/2013  . Urinary system disease 07/18/2013  . Bilateral polycystic ovarian syndrome 07/18/2013  . Polycystic ovaries 07/18/2013  . ASD (atrial septal defect), ostium secundum 07/18/2013    Mary Trujillo PT, MPH  02/22/2018, 5:11 PM  [facility] [place], [place],  [address] Phone: [phone]   Fax:  [phone]  Name: Mary Trujillo MRN: [id number] Date of Birth: 08-30-1955

## 2018-02-22 NOTE — Patient Instructions (Addendum)
Scapula Adduction With Pectoralis Stretch: Low - Standing   Shoulders at 45 hands even with shoulders, keeping weight through legs, shift weight forward until you feel pull or stretch through the front of your chest. Hold _30__ seconds. Do _3__ times, _2-4__ times per day.   Scapula Adduction With Pectoralis Stretch: Mid-Range - Standing   Shoulders at 90 elbows even with shoulders, keeping weight through legs, shift weight forward until you feel pull or strength through the front of your chest. Hold __30_ seconds. Do _3__ times, __2-4_ times per day.   Scapula Adduction With Pectoralis Stretch: High - Standing   Shoulders at 120 hands up high on the doorway, keeping weight on feet, shift weight forward until you feel pull or stretch through the front of your chest. Hold _30__ seconds. Do _3__ times, _2-3__ times per day.   Sitting or standing left elbow tucked in at side, elbow bent to 90 deg - use right arm to push left to the side  Hold 10 sec 3-5 reps 2-3 times/day    Side Bend, Sitting    Sit cross-legged on floor. Bend to one side, touching elbow to floor. Hold _15-20__ seconds. To increase stretch, raise other arm above head.  Repeat _3__ times per session. Do _2-3__ sessions per day.

## 2018-02-23 ENCOUNTER — Encounter: Payer: Commercial Managed Care - PPO | Admitting: Rehabilitative and Restorative Service Providers"

## 2018-02-27 ENCOUNTER — Ambulatory Visit (INDEPENDENT_AMBULATORY_CARE_PROVIDER_SITE_OTHER): Payer: Commercial Managed Care - PPO | Admitting: Rehabilitative and Restorative Service Providers"

## 2018-02-27 ENCOUNTER — Encounter: Payer: Self-pay | Admitting: Rehabilitative and Restorative Service Providers"

## 2018-02-27 DIAGNOSIS — M25512 Pain in left shoulder: Secondary | ICD-10-CM

## 2018-02-27 DIAGNOSIS — R29898 Other symptoms and signs involving the musculoskeletal system: Secondary | ICD-10-CM | POA: Diagnosis not present

## 2018-02-27 DIAGNOSIS — M6281 Muscle weakness (generalized): Secondary | ICD-10-CM

## 2018-02-27 DIAGNOSIS — R293 Abnormal posture: Secondary | ICD-10-CM | POA: Diagnosis not present

## 2018-02-27 NOTE — Therapy (Signed)
[place], [place], [address] Phone: [phone]   Fax:  [phone]  Physical Therapy Treatment  Patient Details  Name: Mary Trujillo MRN: [id number] Date of Birth: May 30, 1955 Referring Provider (PT): Dr Susa Day   Encounter Date: 02/27/2018  PT End of Session - 02/27/18 1653    Visit Number  11    Number of Visits  12    Date for PT Re-Evaluation  03/06/18    PT Start Time  6378    PT Stop Time  1707    PT Time Calculation (min)  58 min    Activity Tolerance  Patient tolerated treatment well       Past Medical History:  Diagnosis Date  . Abnormal pap 2005   CIN 1- Colpo CIN1, Neg ECC  . Ankle fracture   . Anxiety   . Colitis   . COPD (chronic obstructive pulmonary disease) ([place]) 4/15   had spirometry   . Hirsutism   . History of PCOS   . Hx of migraines   . Osteopenia     Past Surgical History:  Procedure Laterality Date  . APPENDECTOMY    . CATARACT EXTRACTION, BILATERAL Bilateral 04/2017  . PARTIAL COLECTOMY Right 12/2002   with appendectomy    There were no vitals filed for this visit.  Subjective Assessment - 02/27/18 1655    Subjective  No sharp pains in the past 2-3 days. Feeling better.  Working on her exercises at home.     Currently in Pain?  No/denies                       [facility] Adult PT Treatment/Exercise - 02/27/18 0001      Shoulder Exercises: Stretch   External Rotation Stretch  3 reps;30 seconds   sitting with good posture scap down and back    Other Shoulder Stretches  3 way doorway 30 sec x 2 each position     Other Shoulder Stretches  lateral trunk flexion reaching       Moist Heat Therapy   Number Minutes Moist Heat  20 Minutes    Moist Heat Location  Shoulder      Electrical Stimulation   Electrical Stimulation Location  Lt shoulder girdle     Electrical Stimulation Action  IFC    Electrical Stimulation Parameters  to  tolerance    Electrical Stimulation Goals  Pain;Tone      Manual Therapy   Manual therapy comments  Pt supine, with LUE supported.     Joint Mobilization  circumduction Lt GH joint     Soft tissue mobilization  deep tissue work through Lt shoulder girdle    Myofascial Release  lateral scapular border     Passive ROM  PROM and end range stretch Lt GH joint into flexion; ER/IR in scapular plane with gentle distraction of joint                   PT Long Term Goals - 02/16/18 1737      PT LONG TERM GOAL #1   Title  Improve posture and alignment with patient demonstrating improved upright posture with posterior shoulder girdle engaged 03/06/18    Time  6    Period  Weeks    Status  On-going      PT LONG TERM GOAL #2   Title  Increase AROM Lt shoulder to within 5-10 degrees of AROM Rt  shoulder 03/06/18    Time  6    Period  Weeks    Status  Partially Met      PT LONG TERM GOAL #3   Title  Increase functional activities with patient using Lt UE to lift items such as groceries and clothing; reaching into closets/cabinets for light weight items with minimal to no pain 03/06/18    Time  6    Period  Weeks    Status  Partially Met      PT LONG TERM GOAL #4   Title  Independent in HEP 03/06/18    Time  6    Period  Weeks    Status  On-going      PT LONG TERM GOAL #5   Title  Improve FOTO to </= 37% limitation 03/06/18    Time  6    Period  Weeks    Status  On-going            Plan - 02/27/18 1654    Clinical Impression Statement  Continued progress. No sudden pains reported in the past 2-3 days. Working on exercises. Manual work and PROM/stretching helpful to decrease pain and increase mobility/function.     Rehab Potential  Good    Clinical Impairments Affecting Rehab Potential  forward posture; abnormal movement patterns Lt UE; muscular imbalance; muscular tightness     PT Frequency  2x / week    PT Duration  6 weeks    PT Treatment/Interventions  Patient/family  education;ADLs/Self Care Home Management;Cryotherapy;Electrical Stimulation;Iontophoresis '4mg'$ /ml Dexamethasone;Moist Heat;Ultrasound;Dry needling;Manual techniques;Neuromuscular re-education;Therapeutic activities;Therapeutic exercise    PT Next Visit Plan  continue progressive ROM/ strengthening exercises for Lt shoulder.  assess response to [employer] and Agree with Plan of Care  Patient       Patient will benefit from skilled therapeutic intervention in order to improve the following deficits and impairments:  Postural dysfunction, Improper body mechanics, Pain, Increased fascial restricitons, Increased muscle spasms, Decreased mobility, Decreased range of motion, Decreased strength, Decreased activity tolerance  Visit Diagnosis: Acute pain of left shoulder  Other symptoms and signs involving the musculoskeletal system  Muscle weakness (generalized)  Abnormal posture     Problem List Patient Active Problem List   Diagnosis Date Noted  . Herpes simplex type 1 infection 12/01/2013  . Cervical sympathetic dystrophy 12/01/2013  . FO (foramen ovale) 12/01/2013  . Renal insufficiency, mild 12/01/2013  . 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 CAFL (chronic airflow limitation) ([place]) 09/27/2013  . Urinary system disease 07/18/2013  . Bilateral polycystic ovarian syndrome 07/18/2013  . Polycystic ovaries 07/18/2013  . ASD (atrial septal defect), ostium secundum 07/18/2013    Celyn Nilda Simmer PT, MPH  02/27/2018, 4:57 PM  [facility] [place] [address] [place], [place], [address] Phone: [phone]   Fax:  [phone]  Name: Mary Trujillo MRN: [id number] Date of Birth: May 31, 1955

## 2018-03-02 ENCOUNTER — Encounter: Payer: Commercial Managed Care - PPO | Admitting: Rehabilitative and Restorative Service Providers"

## 2018-03-08 ENCOUNTER — Ambulatory Visit (INDEPENDENT_AMBULATORY_CARE_PROVIDER_SITE_OTHER): Payer: Commercial Managed Care - PPO | Admitting: Physical Therapy

## 2018-03-08 DIAGNOSIS — R29898 Other symptoms and signs involving the musculoskeletal system: Secondary | ICD-10-CM | POA: Diagnosis not present

## 2018-03-08 DIAGNOSIS — M25512 Pain in left shoulder: Secondary | ICD-10-CM

## 2018-03-08 DIAGNOSIS — M6281 Muscle weakness (generalized): Secondary | ICD-10-CM | POA: Diagnosis not present

## 2018-03-08 NOTE — Therapy (Signed)
[place], [place], [address] Phone: [phone]   Fax:  [phone]  Physical Therapy Treatment  Patient Details  Name: Mary Trujillo MRN: [id number] Date of Birth: July 04, 1955 Referring Provider (PT): Dr Susa Day   Encounter Date: 03/08/2018  PT End of Session - 03/08/18 1646    Visit Number  12    Number of Visits  18    Date for PT Re-Evaluation  04/19/18    PT Start Time  2979   pt arrived late   PT Stop Time  1655    PT Time Calculation (min)  48 min    Activity Tolerance  Patient tolerated treatment well    Behavior During Therapy  [facility] for tasks assessed/performed       Past Medical History:  Diagnosis Date  . Abnormal pap 2005   CIN 1- Colpo CIN1, Neg ECC  . Ankle fracture   . Anxiety   . Colitis   . COPD (chronic obstructive pulmonary disease) ([place]) 4/15   had spirometry   . Hirsutism   . History of PCOS   . Hx of migraines   . Osteopenia     Past Surgical History:  Procedure Laterality Date  . APPENDECTOMY    . CATARACT EXTRACTION, BILATERAL Bilateral 04/2017  . PARTIAL COLECTOMY Right 12/2002   with appendectomy    There were no vitals filed for this visit.  Subjective Assessment - 03/08/18 1609    Subjective  Pt complains her shoulder has been achey at end of each day.  "I trust it more and more, so I am doing more".  Pt reports she continues to be challenged with taking jacket on and off.     Patient Stated Goals  to get back to normal and return to skating     Currently in Pain?  No/denies         [facility] PT Assessment - 03/08/18 0001      Assessment   Medical Diagnosis  Fracture Lt proximal humerus    Referring Provider (PT)  Dr Susa Day    Onset Date/Surgical Date  12/08/17    Hand Dominance  Right    Next MD Visit  PRN      Observation/Other Assessments   Focus on Therapeutic Outcomes (FOTO)   37% limited      AROM   Left Shoulder  Extension  53 Degrees    Left Shoulder Flexion  130 Degrees    Left Shoulder ABduction  122 Degrees    Left Shoulder Internal Rotation  --   Lt thumb to T11   Left Shoulder External Rotation  63 Degrees   supine, abdct ~70 deg     PROM   Left Shoulder ABduction  140 Degrees    Left Shoulder External Rotation  70 Degrees       [facility] Adult PT Treatment/Exercise - 03/08/18 0001      Exercises   Exercises  Shoulder      Shoulder Exercises: Pulleys   Flexion  --   10 sec hold x 5   Scaption  --   10 sec hold x 5 reps      Shoulder Exercises: ROM/Strengthening   UBE (Upper Arm Bike)  L1: 1 min forward/ 1 min backward      Shoulder Exercises: Stretch   Internal Rotation Stretch Limitations  hand behind back, lifting back of palm off of buttocks x 15 reps  Other Shoulder Stretches  3 way doorway 30 sec x 2 each position     Other Shoulder Stretches  lat stretch holding sink and walking backwards x 20 sec x 2; reaching up bilat arms up cabinet for shoulder stretch x 15 sec     External Rotation Stretch Limitations  elbow press (bilat hands behind head in supine) x 20 sec x 2.       Moist Heat Therapy   Number Minutes Moist Heat  12 Minutes    Moist Heat Location  Shoulder      Electrical Stimulation   Electrical Stimulation Location  Lt shoulder girdle     Electrical Stimulation Action  IFC    Electrical Stimulation Parameters   to tolerance    Electrical Stimulation Goals  Pain;Tone      Manual Therapy   Manual therapy comments  Pt supine, with LUE supported.     Joint Mobilization  circumduction Lt GH joint     Soft tissue mobilization  pin and stretch into flexion with focus on stretch to teres;  STM to Lt serratus, levator, rhomboid.     Myofascial Release  lateral scapular border     Passive ROM  PROM into flexion/ scaption/ ER                   PT Long Term Goals - 03/08/18 1634      PT LONG TERM GOAL #1   Title  Improve posture and alignment with  patient demonstrating improved upright posture with posterior shoulder girdle engaged 04/19/18    Time  12    Period  Weeks    Status  Revised      PT LONG TERM GOAL #2   Title  Increase AROM Lt shoulder to within 5-10 degrees of AROM Rt shoulder 04/19/18    Time  12    Period  Weeks    Status  Revised      PT LONG TERM GOAL #3   Title  Increase functional activities with patient using Lt UE to lift items such as groceries and clothing; reaching into closets/cabinets for light weight items with minimal to no pain 03/06/18    Time  6    Period  Weeks    Status  Achieved      PT LONG TERM GOAL #4   Title  Independent in HEP 04/19/18    Time  12    Period  Weeks    Status  Revised      PT LONG TERM GOAL #5   Title  Improve FOTO to </= 37% limitation 03/06/18    Time  6    Period  Weeks    Status  Achieved            Plan - 03/08/18 1652    Clinical Impression Statement  Pt demonstrated gradual gains in Lt shoulder ROM.  She continues to be limited in functional activities like donning/ doffing coats.  Her FOTO score improved to 37%; she has met her FOTO goal.  She reported mild discomfort with manual therapy and stretches for her shoulder.  Pt has partially met her goals and will benefit from continued PT intervention to max functional mobility.     Rehab Potential  Good    Clinical Impairments Affecting Rehab Potential  forward posture; abnormal movement patterns Lt UE; muscular imbalance; muscular tightness     PT Frequency  2x / week    PT Duration  6  weeks    PT Treatment/Interventions  Patient/family education;ADLs/Self Care Home Management;Cryotherapy;Electrical Stimulation;Iontophoresis '4mg'$ /ml Dexamethasone;Moist Heat;Ultrasound;Dry needling;Manual techniques;Neuromuscular re-education;Therapeutic activities;Therapeutic exercise    PT Next Visit Plan  spoke to supervising PT; will request additional visits.     Consulted and Agree with Plan of Care  Patient        Patient will benefit from skilled therapeutic intervention in order to improve the following deficits and impairments:  Postural dysfunction, Improper body mechanics, Pain, Increased fascial restricitons, Increased muscle spasms, Decreased mobility, Decreased range of motion, Decreased strength, Decreased activity tolerance  Visit Diagnosis: Acute pain of left shoulder - Plan: PT plan of care cert/re-cert  Other symptoms and signs involving the musculoskeletal system - Plan: PT plan of care cert/re-cert  Muscle weakness (generalized) - Plan: PT plan of care cert/re-cert     Problem List Patient Active Problem List   Diagnosis Date Noted  . Herpes simplex type 1 infection 12/01/2013  . Cervical sympathetic dystrophy 12/01/2013  . FO (foramen ovale) 12/01/2013  . Renal insufficiency, mild 12/01/2013  . 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 CAFL (chronic airflow limitation) ([place]) 09/27/2013  . Urinary system disease 07/18/2013  . Bilateral polycystic ovarian syndrome 07/18/2013  . Polycystic ovaries 07/18/2013  . ASD (atrial septal defect), ostium secundum 07/18/2013   Kerin Perna, PTA 03/08/18 5:11 PM   Celyn P. Helene Kelp PT, MPH 03/08/18 5:11 PM    [facility] [place], [place], [address] Phone: [phone]   Fax:  [phone]  Name: Mary Trujillo MRN: [id number] Date of Birth: 1956-02-02

## 2018-03-10 ENCOUNTER — Ambulatory Visit (INDEPENDENT_AMBULATORY_CARE_PROVIDER_SITE_OTHER): Payer: Commercial Managed Care - PPO | Admitting: Rehabilitative and Restorative Service Providers"

## 2018-03-10 ENCOUNTER — Encounter: Payer: Self-pay | Admitting: Rehabilitative and Restorative Service Providers"

## 2018-03-10 DIAGNOSIS — M25512 Pain in left shoulder: Secondary | ICD-10-CM | POA: Diagnosis not present

## 2018-03-10 DIAGNOSIS — M6281 Muscle weakness (generalized): Secondary | ICD-10-CM | POA: Diagnosis not present

## 2018-03-10 DIAGNOSIS — R293 Abnormal posture: Secondary | ICD-10-CM

## 2018-03-10 DIAGNOSIS — R29898 Other symptoms and signs involving the musculoskeletal system: Secondary | ICD-10-CM | POA: Diagnosis not present

## 2018-03-10 NOTE — Patient Instructions (Addendum)
Puppy Dog Pose    From table walk hands out into pose. Deepen chest downward if shoulders allow. Hold for _10-20__ breaths. Repeat ____ times.   Neck Roll / Upper Back Roll / Cactus Arms (Support) - Variation 2    Arms wide, or arms in cactus pose to deepen stretch. Lie across rolled blanket at thoracic spine behind sternum. Support head with second blanket. Remain in position for __1-2__ minutes.

## 2018-03-10 NOTE — Therapy (Signed)
[facility] [place] [address] [place], [place], [address] Phone: [phone]   Fax:  [phone]  Physical Therapy Treatment  Patient Details  Name: Mary Trujillo MRN: [id number] Date of Birth: 02/25/1956 Referring Provider (PT): Dr Jene Every   Encounter Date: 03/10/2018  PT End of Session - 03/10/18 1452    Visit Number  13    Number of Visits  18    Date for PT Re-Evaluation  04/19/18    PT Start Time  1450    PT Stop Time  1550    PT Time Calculation (min)  60 min    Activity Tolerance  Patient tolerated treatment well       Past Medical History:  Diagnosis Date  . Abnormal pap 2005   CIN 1- Colpo CIN1, Neg ECC  . Ankle fracture   . Anxiety   . Colitis   . COPD (chronic obstructive pulmonary disease) ([facility]) 4/15   had spirometry   . Hirsutism   . History of PCOS   . Hx of migraines   . Osteopenia     Past Surgical History:  Procedure Laterality Date  . APPENDECTOMY    . CATARACT EXTRACTION, BILATERAL Bilateral 04/2017  . PARTIAL COLECTOMY Right 12/2002   with appendectomy    There were no vitals filed for this visit.  Subjective Assessment - 03/10/18 1453    Subjective  Getting there - just slow. Most difficulty with raising arm straight up.     Currently in Pain?  No/denies                       [facility] Adult PT Treatment/Exercise - 03/10/18 0001      Shoulder Exercises: Pulleys   Flexion  --   3 reps with increased hold   Scaption  --   3 reps with increased hold      Shoulder Exercises: Stretch   Internal Rotation Stretch Limitations  hand behind back, lifting back of palm off of buttocks x 15 reps     Other Shoulder Stretches  3 way doorway 30 sec x 2 each position     Other Shoulder Stretches  lat stretch holding sink and walking backwards x 20 sec x 2; reaching up bilat arms up cabinet for shoulder stretch x 15 sec     External Rotation Stretch Limitations  yoga stretch  child's pose 30-45 sec x 3; hand under body in prone for cross body stretch 30 sec x 2       Moist Heat Therapy   Number Minutes Moist Heat  20 Minutes    Moist Heat Location  Shoulder      Electrical Stimulation   Electrical Stimulation Location  Lt shoulder girdle     Electrical Stimulation Action  IFC    Electrical Stimulation Parameters  to tolerance    Electrical Stimulation Goals  Pain;Tone      Manual Therapy   Manual therapy comments  Pt supine, with LUE supported. Sidelying     Joint Mobilization  circumduction Lt GH joint; PA and caudal glides      Soft tissue mobilization  deep tissue work through the Lt shoulder girdle area ant/lat/post through trunk and shoulder     Myofascial Release  lateral scapular border     Passive ROM  PROM into flexion/ scaption/ ER              PT Education - 03/10/18 1507  Education Details  HEP    Person(s) Educated  Patient    Methods  Explanation;Demonstration;Tactile cues;Verbal cues;Handout    Comprehension  Verbalized understanding;Returned demonstration;Verbal cues required;Tactile cues required          PT Long Term Goals - 03/08/18 1634      PT LONG TERM GOAL #1   Title  Improve posture and alignment with patient demonstrating improved upright posture with posterior shoulder girdle engaged 04/19/18    Time  12    Period  Weeks    Status  Revised      PT LONG TERM GOAL #2   Title  Increase AROM Lt shoulder to within 5-10 degrees of AROM Rt shoulder 04/19/18    Time  12    Period  Weeks    Status  Revised      PT LONG TERM GOAL #3   Title  Increase functional activities with patient using Lt UE to lift items such as groceries and clothing; reaching into closets/cabinets for light weight items with minimal to no pain 03/06/18    Time  6    Period  Weeks    Status  Achieved      PT LONG TERM GOAL #4   Title  Independent in HEP 04/19/18    Time  12    Period  Weeks    Status  Revised      PT LONG TERM GOAL #5    Title  Improve FOTO to </= 37% limitation 03/06/18    Time  6    Period  Weeks    Status  Achieved            Plan - 03/10/18 1453    Clinical Impression Statement  Patient continues to work hard on HEP - continues to have limited end range motion. Tolerates increased PROM and is increasing exercise tolerance. Progressing gradually toward stated goals of therapy.     Rehab Potential  Good    Clinical Impairments Affecting Rehab Potential  forward posture; abnormal movement patterns Lt UE; muscular imbalance; muscular tightness     PT Frequency  2x / week    PT Duration  6 weeks    PT Treatment/Interventions  Patient/family education;ADLs/Self Care Home Management;Cryotherapy;Electrical Stimulation;Iontophoresis 4mg /ml Dexamethasone;Moist Heat;Ultrasound;Dry needling;Manual techniques;Neuromuscular re-education;Therapeutic activities;Therapeutic exercise    PT Next Visit Plan  spoke to supervising PT; will request additional visits.     Consulted and Agree with Plan of Care  Patient       Patient will benefit from skilled therapeutic intervention in order to improve the following deficits and impairments:  Postural dysfunction, Improper body mechanics, Pain, Increased fascial restricitons, Increased muscle spasms, Decreased mobility, Decreased range of motion, Decreased strength, Decreased activity tolerance  Visit Diagnosis: Acute pain of left shoulder  Other symptoms and signs involving the musculoskeletal system  Muscle weakness (generalized)  Abnormal posture     Problem List Patient Active Problem List   Diagnosis Date Noted  . Herpes simplex type 1 infection 12/01/2013  . Cervical sympathetic dystrophy 12/01/2013  . FO (foramen ovale) 12/01/2013  . Renal insufficiency, mild 12/01/2013  . 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 CAFL (chronic airflow limitation) ([facility]) 09/27/2013  . Urinary system disease 07/18/2013  . Bilateral polycystic ovarian syndrome 07/18/2013  .  Polycystic ovaries 07/18/2013  . ASD (atrial septal defect), ostium secundum 07/18/2013    Kearsten Ginther Rober Minion PT, MPH  03/10/2018, 3:41 PM  [facility] [address]  [place], [place], [address] Phone: [phone]   Fax:  [phone]  Name: Mary Trujillo MRN: [id number] Date of Birth: [birth year]

## 2018-03-13 ENCOUNTER — Ambulatory Visit (INDEPENDENT_AMBULATORY_CARE_PROVIDER_SITE_OTHER): Payer: Commercial Managed Care - PPO | Admitting: Physical Therapy

## 2018-03-13 ENCOUNTER — Encounter: Payer: Self-pay | Admitting: Physical Therapy

## 2018-03-13 DIAGNOSIS — M25512 Pain in left shoulder: Secondary | ICD-10-CM | POA: Diagnosis not present

## 2018-03-13 DIAGNOSIS — M6281 Muscle weakness (generalized): Secondary | ICD-10-CM

## 2018-03-13 DIAGNOSIS — R293 Abnormal posture: Secondary | ICD-10-CM | POA: Diagnosis not present

## 2018-03-13 DIAGNOSIS — R29898 Other symptoms and signs involving the musculoskeletal system: Secondary | ICD-10-CM

## 2018-03-13 NOTE — Therapy (Signed)
[facility] [place] [address] [place] [address] [place], [place], [address] Phone: [phone]   Fax:  [phone]  Physical Therapy Treatment  Patient Details  Name: Mary Trujillo MRN: [id number] Date of Birth: [birth year] Referring Provider (PT): Dr Jene Every   Encounter Date: 03/13/2018  PT End of Session - 03/13/18 0724    Visit Number  14    Number of Visits  18    Date for PT Re-Evaluation  04/19/18    PT Start Time  0720   pt arrived late   PT Stop Time  0815    PT Time Calculation (min)  55 min    Activity Tolerance  Patient tolerated treatment well    Behavior During Therapy  [facility] for tasks assessed/performed       Past Medical History:  Diagnosis Date  . Abnormal pap 2005   CIN 1- Colpo CIN1, Neg ECC  . Ankle fracture   . Anxiety   . Colitis   . COPD (chronic obstructive pulmonary disease) (HCC) 4/15   had spirometry   . Hirsutism   . History of PCOS   . Hx of migraines   . Osteopenia     Past Surgical History:  Procedure Laterality Date  . APPENDECTOMY    . CATARACT EXTRACTION, BILATERAL Bilateral 04/2017  . PARTIAL COLECTOMY Right 12/2002   with appendectomy    There were no vitals filed for this visit.  Subjective Assessment - 03/13/18 0725    Subjective  Pt tried on dresses for upcoming wedding this weekend; "It went better than I expected (dresses over her head and zipping zippers)".   She had soreness from last session that lasted until next morning.     Currently in Pain?  No/denies    Pain Score  0-No pain         [facility] PT Assessment - 03/13/18 0001      Assessment   Medical Diagnosis  Fracture Lt proximal humerus    Referring Provider (PT)  Dr Jene Every    Onset Date/Surgical Date  12/08/17    Hand Dominance  Right    Next MD Visit  PRN      PROM   Left Shoulder External Rotation  76 Degrees      [facility] Adult PT Treatment/Exercise - 03/13/18 0001      Shoulder Exercises: Standing    Extension  AAROM;10 reps   5 sec hold     Shoulder Exercises: ROM/Strengthening   UBE (Upper Arm Bike)  L1: 1 min forward/ 1 min backward      Shoulder Exercises: Stretch   Internal Rotation Stretch  --   dowel behind back, up over buttocks x 10   Internal Rotation Stretch Limitations  hand behind back, lifting back of palm off of buttocks x 10 reps     Other Shoulder Stretches  3 way doorway 30 sec x 2 each position     External Rotation Stretch Limitations  yoga stretch child's pose 20 sec x 3; (thread the needle pose) hand under body in prone for cross body stretch 30 sec x 2       Moist Heat Therapy   Number Minutes Moist Heat  15 Minutes    Moist Heat Location  Shoulder      Electrical Stimulation   Electrical Stimulation Location  Lt shoulder girdle     Electrical Stimulation Action  IFC    Electrical Stimulation Parameters  to tolerance  Electrical Stimulation Goals  Pain      Manual Therapy   Joint Mobilization  circumduction Lt GH joint     Soft tissue mobilization  IASTM to Lt infraspinatus/teres and rhomboid, upper and mid trap, to decrease fascial tightness and improve ROM.  TPR to Lt infraspinatus and subscap with contract relax.     Myofascial Release  lateral scapular border     Passive ROM  PROM into flexion/ scaption/ ER                   PT Long Term Goals - 03/08/18 1634      PT LONG TERM GOAL #1   Title  Improve posture and alignment with patient demonstrating improved upright posture with posterior shoulder girdle engaged 04/19/18    Time  12    Period  Weeks    Status  Revised      PT LONG TERM GOAL #2   Title  Increase AROM Lt shoulder to within 5-10 degrees of AROM Rt shoulder 04/19/18    Time  12    Period  Weeks    Status  Revised      PT LONG TERM GOAL #3   Title  Increase functional activities with patient using Lt UE to lift items such as groceries and clothing; reaching into closets/cabinets for light weight items with  minimal to no pain 03/06/18    Time  6    Period  Weeks    Status  Achieved      PT LONG TERM GOAL #4   Title  Independent in HEP 04/19/18    Time  12    Period  Weeks    Status  Revised      PT LONG TERM GOAL #5   Title  Improve FOTO to </= 37% limitation 03/06/18    Time  6    Period  Weeks    Status  Achieved            Plan - 03/13/18 0805    Clinical Impression Statement  Pt reporting improved functional mobility with tasks such as dressing, although still limited compared to RUE.  POint tender trigger points in Lt infraspinatus, subscap, and rhomboid.  Improved with manual therapy.     Rehab Potential  Good    PT Frequency  2x / week    PT Duration  6 weeks    PT Treatment/Interventions  Patient/family education;ADLs/Self Care Home Management;Cryotherapy;Electrical Stimulation;Iontophoresis 4mg /ml Dexamethasone;Moist Heat;Ultrasound;Dry needling;Manual techniques;Neuromuscular re-education;Therapeutic activities;Therapeutic exercise    Consulted and Agree with Plan of Care  Patient       Patient will benefit from skilled therapeutic intervention in order to improve the following deficits and impairments:  Postural dysfunction, Improper body mechanics, Pain, Increased fascial restricitons, Increased muscle spasms, Decreased mobility, Decreased range of motion, Decreased strength, Decreased activity tolerance  Visit Diagnosis: Acute pain of left shoulder  Other symptoms and signs involving the musculoskeletal system  Muscle weakness (generalized)  Abnormal posture     Problem List Patient Active Problem List   Diagnosis Date Noted  . Herpes simplex type 1 infection 12/01/2013  . Cervical sympathetic dystrophy 12/01/2013  . FO (foramen ovale) 12/01/2013  . Renal insufficiency, mild 12/01/2013  . 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 CAFL (chronic airflow limitation) (HCC) 09/27/2013  . Urinary system disease 07/18/2013  . Bilateral polycystic ovarian syndrome  07/18/2013  . Polycystic ovaries 07/18/2013  . ASD (atrial septal defect), ostium secundum 07/18/2013   Mayer Camel,  PTA 03/13/18 12:50 PM  [facility] [place] [address] [place] [address] [place], [place], [address] Phone: [phone]   Fax:  [phone]  Name: Mary Trujillo MRN: [id number] Date of Birth: 1955-08-26

## 2018-03-22 ENCOUNTER — Encounter: Payer: Commercial Managed Care - PPO | Admitting: Physical Therapy

## 2018-03-23 ENCOUNTER — Encounter: Payer: Self-pay | Admitting: Physical Therapy

## 2018-03-23 ENCOUNTER — Ambulatory Visit (INDEPENDENT_AMBULATORY_CARE_PROVIDER_SITE_OTHER): Payer: Commercial Managed Care - PPO | Admitting: Physical Therapy

## 2018-03-23 DIAGNOSIS — R293 Abnormal posture: Secondary | ICD-10-CM

## 2018-03-23 DIAGNOSIS — M6281 Muscle weakness (generalized): Secondary | ICD-10-CM

## 2018-03-23 DIAGNOSIS — M25512 Pain in left shoulder: Secondary | ICD-10-CM | POA: Diagnosis not present

## 2018-03-23 DIAGNOSIS — R29898 Other symptoms and signs involving the musculoskeletal system: Secondary | ICD-10-CM | POA: Diagnosis not present

## 2018-03-23 NOTE — Patient Instructions (Signed)
Sash   On back, knees bent, feet flat, left hand on left hip, right hand above left. Pull right arm DIAGONALLY (hip to shoulder) across chest. Bring right arm along head toward floor. Hold momentarily. Slowly return to starting position. Repeat _10__ times, 2 sets. Do with left, then right arm. Band color __red____

## 2018-03-23 NOTE — Therapy (Signed)
[facility] [place] [address] [place] [address] [place], [place], [address] Phone: [phone]   Fax:  [phone]  Physical Therapy Treatment  Patient Details  Name: Mary Trujillo MRN: [id number] Date of Birth: 11/05/1955 Referring Provider (PT): Dr Jene EveryJeffrey Beane   Encounter Date: 03/23/2018  PT End of Session - 03/23/18 1712    Visit Number  15    Number of Visits  18    Date for PT Re-Evaluation  04/19/18    PT Start Time  1700    PT Stop Time  1754    PT Time Calculation (min)  54 min    Activity Tolerance  Patient tolerated treatment well;No increased pain    Behavior During Therapy  WFL for tasks assessed/performed       Past Medical History:  Diagnosis Date  . Abnormal pap 2005   CIN 1- Colpo CIN1, Neg ECC  . Ankle fracture   . Anxiety   . Colitis   . COPD (chronic obstructive pulmonary disease) (HCC) 4/15   had spirometry   . Hirsutism   . History of PCOS   . Hx of migraines   . Osteopenia     Past Surgical History:  Procedure Laterality Date  . APPENDECTOMY    . CATARACT EXTRACTION, BILATERAL Bilateral 04/2017  . PARTIAL COLECTOMY Right 12/2002   with appendectomy    There were no vitals filed for this visit.  Subjective Assessment - 03/23/18 1751    Subjective  Pt voices frustration, "Things are about the same".  she reports she is "ready to be better all ready" .    Patient Stated Goals  to get back to normal and return to skating     Currently in Pain?  No/denies    Pain Score  0-No pain         [facility] PT Assessment - 03/23/18 0001      Assessment   Medical Diagnosis  Fracture Lt proximal humerus    Referring Provider (PT)  Dr Jene EveryJeffrey Beane    Onset Date/Surgical Date  12/08/17    Hand Dominance  Right    Next MD Visit  PRN      AROM   Left Shoulder ABduction  124 Degrees      PROM   Left Shoulder External Rotation  85 Degrees         [facility] Adult PT Treatment/Exercise - 03/23/18 0001      Exercises   Exercises  Shoulder      Shoulder Exercises: Supine   Other Supine Exercises  sash with Red band x 10 reps each arm (RUE for comparison)      Shoulder Exercises: Pulleys   Flexion  --   8 reps with increased hold   Scaption  --   8 reps with increased hold      Shoulder Exercises: ROM/Strengthening   UBE (Upper Arm Bike)  L1: 1 min forward/ 1 min backward      Shoulder Exercises: Stretch   Other Shoulder Stretches  middle position doorway stretch x 30 sec x 3 reps      Modalities   Modalities  Moist Heat;Electrical Stimulation;Ultrasound      Moist Heat Therapy   Number Minutes Moist Heat  12 Minutes    Moist Heat Location  Shoulder   L     Electrical Stimulation   Electrical Stimulation Location  Lt shoulder girdle     Electrical Stimulation Action  IFC    Electrical  Stimulation Parameters  to pt tolerance x 12 min     Electrical Stimulation Goals  Pain;Tone      Ultrasound   Ultrasound Location  Lt pec and bicep / Lt teres major    Ultrasound Parameters  100%, 1.1 w/cm2, 4 min to front, 4 min to back     Ultrasound Goals  Pain   tightness     Manual Therapy   Joint Mobilization  circumduction Lt GH joint     Soft tissue mobilization  IASTM to Lt infraspinatus/teres and ant deltoid/pec/bicep to decrease fascial tightness and improve ROM.     Myofascial Release  Lt pec release (point tender pec minor)     Passive ROM  PROM into flexion/ horiz abdct/ ext / scaption/ ER /             PT Education - 03/23/18 1751    Education Details  HEP     Person(s) Educated  Patient    Methods  Explanation;Handout;Verbal cues;Demonstration    Comprehension  Verbalized understanding;Returned demonstration          PT Long Term Goals - 03/08/18 1634      PT LONG TERM GOAL #1   Title  Improve posture and alignment with patient demonstrating improved upright posture with posterior shoulder girdle engaged 04/19/18    Time  12    Period  Weeks    Status   Revised      PT LONG TERM GOAL #2   Title  Increase AROM Lt shoulder to within 5-10 degrees of AROM Rt shoulder 04/19/18    Time  12    Period  Weeks    Status  Revised      PT LONG TERM GOAL #3   Title  Increase functional activities with patient using Lt UE to lift items such as groceries and clothing; reaching into closets/cabinets for light weight items with minimal to no pain 03/06/18    Time  6    Period  Weeks    Status  Achieved      PT LONG TERM GOAL #4   Title  Independent in HEP 04/19/18    Time  12    Period  Weeks    Status  Revised      PT LONG TERM GOAL #5   Title  Improve FOTO to </= 37% limitation 03/06/18    Time  6    Period  Weeks    Status  Achieved            Plan - 03/23/18 1748    Clinical Impression Statement  Pt demonstrated improved Lt shoulder ER PROM.  Point tender and tight in Lt pec minor, subscap, and teres major/minor.  Pt demonstrated improved ROM with less reported discomfort after [place] and manual therapy.  Pt making gradual gains towards remaining goals.     Rehab Potential  Good    Clinical Impairments Affecting Rehab Potential  forward posture; abnormal movement patterns Lt UE; muscular imbalance; muscular tightness     PT Frequency  2x / week    PT Duration  6 weeks    PT Treatment/Interventions  Patient/family education;ADLs/Self Care Home Management;Cryotherapy;Electrical Stimulation;Iontophoresis 4mg /ml Dexamethasone;Moist Heat;Ultrasound;Dry needling;Manual techniques;Neuromuscular re-education;Therapeutic activities;Therapeutic exercise    PT Next Visit Plan  continue progressive ROM and strengthening of Lt shoulder.     PT Home Exercise Plan  self massage with ball to Lt teres and pec     Consulted and Agree with Plan of  Care  Patient       Patient will benefit from skilled therapeutic intervention in order to improve the following deficits and impairments:  Postural dysfunction, Improper body mechanics, Pain, Increased fascial  restricitons, Increased muscle spasms, Decreased mobility, Decreased range of motion, Decreased strength, Decreased activity tolerance  Visit Diagnosis: Acute pain of left shoulder  Other symptoms and signs involving the musculoskeletal system  Muscle weakness (generalized)  Abnormal posture     Problem List Patient Active Problem List   Diagnosis Date Noted  . Herpes simplex type 1 infection 12/01/2013  . Cervical sympathetic dystrophy 12/01/2013  . FO (foramen ovale) 12/01/2013  . Renal insufficiency, mild 12/01/2013  . 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 CAFL (chronic airflow limitation) (HCC) 09/27/2013  . Urinary system disease 07/18/2013  . Bilateral polycystic ovarian syndrome 07/18/2013  . Polycystic ovaries 07/18/2013  . ASD (atrial septal defect), ostium secundum 07/18/2013   Mayer Camel, PTA 03/23/18 5:53 PM  [facility] [place] [address] [place], [place], [address] Phone: [phone]   Fax:  [phone]  Name: Mary Trujillo MRN: [id number] Date of Birth: 10/05/1955

## 2018-03-28 ENCOUNTER — Ambulatory Visit (INDEPENDENT_AMBULATORY_CARE_PROVIDER_SITE_OTHER): Payer: Commercial Managed Care - PPO | Admitting: Rehabilitative and Restorative Service Providers"

## 2018-03-28 ENCOUNTER — Encounter: Payer: Self-pay | Admitting: Rehabilitative and Restorative Service Providers"

## 2018-03-28 DIAGNOSIS — M6281 Muscle weakness (generalized): Secondary | ICD-10-CM

## 2018-03-28 DIAGNOSIS — M25512 Pain in left shoulder: Secondary | ICD-10-CM

## 2018-03-28 DIAGNOSIS — R293 Abnormal posture: Secondary | ICD-10-CM | POA: Diagnosis not present

## 2018-03-28 DIAGNOSIS — R29898 Other symptoms and signs involving the musculoskeletal system: Secondary | ICD-10-CM | POA: Diagnosis not present

## 2018-03-28 NOTE — Therapy (Signed)
[facility] [place] [address] [place], [place], [address] Phone: [phone]   Fax:  [phone]  Physical Therapy Treatment  Patient Details  Name: Mary Trujillo MRN: [id number] Date of Birth: March 22, 1956 Referring Provider (PT): Dr Jene Every   Encounter Date: 03/28/2018  PT End of Session - 03/28/18 1626    Visit Number  16    Number of Visits  18    Date for PT Re-Evaluation  04/19/18    PT Start Time  1655    PT Stop Time  1754    PT Time Calculation (min)  59 min       Past Medical History:  Diagnosis Date  . Abnormal pap 2005   CIN 1- Colpo CIN1, Neg ECC  . Ankle fracture   . Anxiety   . Colitis   . COPD (chronic obstructive pulmonary disease) (HCC) 4/15   had spirometry   . Hirsutism   . History of PCOS   . Hx of migraines   . Osteopenia     Past Surgical History:  Procedure Laterality Date  . APPENDECTOMY    . CATARACT EXTRACTION, BILATERAL Bilateral 04/2017  . PARTIAL COLECTOMY Right 12/2002   with appendectomy    There were no vitals filed for this visit.  Subjective Assessment - 03/28/18 1626    Subjective  Hit elbow when sitting into her chair at work yesterday and jarred her Lt shoulder - has been sore since then.     Currently in Pain?  No/denies         [facility] PT Assessment - 03/28/18 0001      Assessment   Medical Diagnosis  Fracture Lt proximal humerus    Referring Provider (PT)  Dr Jene Every    Onset Date/Surgical Date  12/08/17    Hand Dominance  Right    Next MD Visit  PRN      AROM   Right Shoulder Flexion  154 Degrees    Left Shoulder Flexion  148 Degrees   some elevatioin of scapula/associated movement patterns                   [facility] Adult PT Treatment/Exercise - 03/28/18 0001      Shoulder Exercises: Seated   External Rotation  AROM;Strengthening;Left   shd 90 deg abd; elbow 90 deg flex for end range ER x 30reps     Shoulder Exercises:  Standing   Other Standing Exercises  active stretch into flexion; wand across doorway for stretch into flexion with wand at knee level and overhead 30 sec x 2 reps each position       Shoulder Exercises: Therapy Ball   Other Therapy Ball Exercises  stepping under large ball for shd flexion 30 sec x 3       Shoulder Exercises: Stretch   Other Shoulder Stretches  3 way doorway stretch 30 sec x 3 each position       Moist Heat Therapy   Number Minutes Moist Heat  15 Minutes    Moist Heat Location  Shoulder   L     Electrical Stimulation   Electrical Stimulation Location  Lt shoulder girdle     Electrical Stimulation Action  IFC    Electrical Stimulation Parameters  to tolerance    Electrical Stimulation Goals  Pain;Tone      Manual Therapy   Joint Mobilization  inferior mobs 90 deg abd with stretch 10-20 sec hold x 5-7 reps  Soft tissue mobilization  deep tissue work therough the Lt shoulder girdle     Passive ROM  PROM/stretch into ER and flexion              PT Education - 03/28/18 1653    Education Details  HEP     Person(s) Educated  Patient    Methods  Explanation;Demonstration;Tactile cues;Verbal cues;Handout    Comprehension  Verbalized understanding;Returned demonstration;Verbal cues required;Tactile cues required          PT Long Term Goals - 03/08/18 1634      PT LONG TERM GOAL #1   Title  Improve posture and alignment with patient demonstrating improved upright posture with posterior shoulder girdle engaged 04/19/18    Time  12    Period  Weeks    Status  Revised      PT LONG TERM GOAL #2   Title  Increase AROM Lt shoulder to within 5-10 degrees of AROM Rt shoulder 04/19/18    Time  12    Period  Weeks    Status  Revised      PT LONG TERM GOAL #3   Title  Increase functional activities with patient using Lt UE to lift items such as groceries and clothing; reaching into closets/cabinets for light weight items with minimal to no pain 03/06/18     Time  6    Period  Weeks    Status  Achieved      PT LONG TERM GOAL #4   Title  Independent in HEP 04/19/18    Time  12    Period  Weeks    Status  Revised      PT LONG TERM GOAL #5   Title  Improve FOTO to </= 37% limitation 03/06/18    Time  6    Period  Weeks    Status  Achieved            Plan - 03/28/18 1626    Clinical Impression Statement  Continued improvement in ROM and function. Patient continues to have abnormal movement patterns with elevation of Lt UE and end range tightness with A/ROM. Responds well to passive stretching.     Rehab Potential  Good    Clinical Impairments Affecting Rehab Potential  forward posture; abnormal movement patterns Lt UE; muscular imbalance; muscular tightness     PT Frequency  2x / week    PT Duration  6 weeks    PT Treatment/Interventions  Patient/family education;ADLs/Self Care Home Management;Cryotherapy;Electrical Stimulation;Iontophoresis 4mg /ml Dexamethasone;Moist Heat;Ultrasound;Dry needling;Manual techniques;Neuromuscular re-education;Therapeutic activities;Therapeutic exercise    PT Next Visit Plan  continue progressive ROM and strengthening of Lt shoulder.     PT Home Exercise Plan  self massage with ball to Lt teres and pec     Consulted and Agree with Plan of Care  Patient       Patient will benefit from skilled therapeutic intervention in order to improve the following deficits and impairments:  Postural dysfunction, Improper body mechanics, Pain, Increased fascial restricitons, Increased muscle spasms, Decreased mobility, Decreased range of motion, Decreased strength, Decreased activity tolerance  Visit Diagnosis: Acute pain of left shoulder  Other symptoms and signs involving the musculoskeletal system  Muscle weakness (generalized)  Abnormal posture     Problem List Patient Active Problem List   Diagnosis Date Noted  . Herpes simplex type 1 infection 12/01/2013  . Cervical sympathetic dystrophy 12/01/2013   . FO (foramen ovale) 12/01/2013  . Renal insufficiency, mild 12/01/2013  .  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 CAFL (chronic airflow limitation) (HCC) 09/27/2013  . Urinary system disease 07/18/2013  . Bilateral polycystic ovarian syndrome 07/18/2013  . Polycystic ovaries 07/18/2013  . ASD (atrial septal defect), ostium secundum 07/18/2013    Omaria Plunk Rober MinionP Edris Schneck PT, MPH  03/28/2018, 5:06 PM  [facility] [address] [place] [address] [place], [place], [address] Phone: [phone]   Fax:  [phone]  Name: Mary Trujillo MRN: [id number] Date of Birth: 11/11/1955

## 2018-03-28 NOTE — Patient Instructions (Addendum)
Scapula Adduction With Pectoralis Stretch: Low - Standing   Shoulders at 45 hands even with shoulders, keeping weight through legs, shift weight forward until you feel pull or stretch through the front of your chest. Hold _30__ seconds. Do _3__ times, _2-4__ times per day.   Scapula Adduction With Pectoralis Stretch: Mid-Range - Standing   Shoulders at 90 elbows even with shoulders, keeping weight through legs, shift weight forward until you feel pull or strength through the front of your chest. Hold __30_ seconds. Do _3__ times, __2-4_ times per day.   Scapula Adduction With Pectoralis Stretch: High - Standing   Shoulders at 120 hands up high on the doorway, keeping weight on feet, shift weight forward until you feel pull or stretch through the front of your chest. Hold _30__ seconds. Do _3__ times, _2-3__ times per day.   Stick or cane across doorway stepping forward or leaning back for stretch  30-45 sec 2-3 reps

## 2018-04-06 ENCOUNTER — Other Ambulatory Visit: Payer: Self-pay | Admitting: Obstetrics & Gynecology

## 2018-04-06 DIAGNOSIS — Z1231 Encounter for screening mammogram for malignant neoplasm of breast: Secondary | ICD-10-CM

## 2018-04-12 ENCOUNTER — Ambulatory Visit (INDEPENDENT_AMBULATORY_CARE_PROVIDER_SITE_OTHER): Payer: Commercial Managed Care - PPO | Admitting: Rehabilitative and Restorative Service Providers"

## 2018-04-12 ENCOUNTER — Encounter: Payer: Self-pay | Admitting: Rehabilitative and Restorative Service Providers"

## 2018-04-12 DIAGNOSIS — R29898 Other symptoms and signs involving the musculoskeletal system: Secondary | ICD-10-CM

## 2018-04-12 DIAGNOSIS — R293 Abnormal posture: Secondary | ICD-10-CM | POA: Diagnosis not present

## 2018-04-12 DIAGNOSIS — M25512 Pain in left shoulder: Secondary | ICD-10-CM | POA: Diagnosis not present

## 2018-04-12 DIAGNOSIS — M6281 Muscle weakness (generalized): Secondary | ICD-10-CM

## 2018-04-12 NOTE — Therapy (Signed)
[facility] [place] [address] [place], [place], [address] Phone: [phone]   Fax:  [phone]  Physical Therapy Treatment  Patient Details  Name: Mary Trujillo MRN: [id number] Date of Birth: 1956-01-04 Referring Provider (PT): Dr Jene Every   Encounter Date: 04/12/2018  PT End of Session - 04/12/18 1614    Visit Number  17    Number of Visits  18    Date for PT Re-Evaluation  04/19/18    PT Start Time  1605    PT Stop Time  1700    PT Time Calculation (min)  55 min    Activity Tolerance  Patient tolerated treatment well       Past Medical History:  Diagnosis Date  . Abnormal pap 2005   CIN 1- Colpo CIN1, Neg ECC  . Ankle fracture   . Anxiety   . Colitis   . COPD (chronic obstructive pulmonary disease) (HCC) 4/15   had spirometry   . Hirsutism   . History of PCOS   . Hx of migraines   . Osteopenia     Past Surgical History:  Procedure Laterality Date  . APPENDECTOMY    . CATARACT EXTRACTION, BILATERAL Bilateral 04/2017  . PARTIAL COLECTOMY Right 12/2002   with appendectomy    There were no vitals filed for this visit.      [facility] PT Assessment - 04/12/18 0001      Assessment   Medical Diagnosis  Fracture Lt proximal humerus    Referring Provider (PT)  Dr Jene Every    Onset Date/Surgical Date  12/08/17    Hand Dominance  Right    Next MD Visit  PRN      PROM   Left Shoulder External Rotation  90 Degrees   supine post stretching                   [facility] Adult PT Treatment/Exercise - 04/12/18 0001      Shoulder Exercises: Seated   External Rotation  AROM;Strengthening;Left;10 reps;Theraband   shd 90 deg/elbow 90 deg    Theraband Level (Shoulder External Rotation)  Level 2 (Red)      Shoulder Exercises: Standing   Other Standing Exercises  Lt shoulder flexion to 90 and 120 deg ball on ball at dorsum of hand       Shoulder Exercises: Therapy Ball   Other Therapy Ball  Exercises  stepping under large ball for shd flexion 30 sec x 3       Shoulder Exercises: ROM/Strengthening   UBE (Upper Arm Bike)  L3: 1 min forward/ 1 min backward      Electrical Stimulation   Electrical Stimulation Location  Lt shoulder girdle     Electrical Stimulation Action  IFC    Electrical Stimulation Parameters  to tolerance    Electrical Stimulation Goals  Pain;Tone      Manual Therapy   Joint Mobilization  inferior mobs 90 deg abd with stretch 10-20 sec hold x 5-7 reps     Soft tissue mobilization  deep tissue work therough the Lt shoulder girdle     Passive ROM  PROM/stretch into ER and flexion              PT Education - 04/12/18 1657    Education Details  HEP    Person(s) Educated  Patient    Methods  Explanation;Demonstration;Tactile cues;Verbal cues;Handout    Comprehension  Verbalized understanding;Returned demonstration;Verbal cues required;Tactile cues required  PT Long Term Goals - 03/08/18 1634      PT LONG TERM GOAL #1   Title  Improve posture and alignment with patient demonstrating improved upright posture with posterior shoulder girdle engaged 04/19/18    Time  12    Period  Weeks    Status  Revised      PT LONG TERM GOAL #2   Title  Increase AROM Lt shoulder to within 5-10 degrees of AROM Rt shoulder 04/19/18    Time  12    Period  Weeks    Status  Revised      PT LONG TERM GOAL #3   Title  Increase functional activities with patient using Lt UE to lift items such as groceries and clothing; reaching into closets/cabinets for light weight items with minimal to no pain 03/06/18    Time  6    Period  Weeks    Status  Achieved      PT LONG TERM GOAL #4   Title  Independent in HEP 04/19/18    Time  12    Period  Weeks    Status  Revised      PT LONG TERM GOAL #5   Title  Improve FOTO to </= 37% limitation 03/06/18    Time  6    Period  Weeks    Status  Achieved            Plan - 04/12/18 1614    Clinical  Impression Statement  Continued gradualy progress - notices that the pain that was lasting after stretching or awkward movements is gone. Still has some pain end range stretch and some reaching activities but ROM is improving.     Rehab Potential  Good    Clinical Impairments Affecting Rehab Potential  forward posture; abnormal movement patterns Lt UE; muscular imbalance; muscular tightness     PT Frequency  2x / week    PT Duration  6 weeks    PT Treatment/Interventions  Patient/family education;ADLs/Self Care Home Management;Cryotherapy;Electrical Stimulation;Iontophoresis 4mg /ml Dexamethasone;Moist Heat;Ultrasound;Dry needling;Manual techniques;Neuromuscular re-education;Therapeutic activities;Therapeutic exercise    PT Next Visit Plan  continue progressive ROM and strengthening of Lt shoulder.     PT Home Exercise Plan  self massage with ball to Lt teres and pec     Consulted and Agree with Plan of Care  Patient       Patient will benefit from skilled therapeutic intervention in order to improve the following deficits and impairments:  Postural dysfunction, Improper body mechanics, Pain, Increased fascial restricitons, Increased muscle spasms, Decreased mobility, Decreased range of motion, Decreased strength, Decreased activity tolerance  Visit Diagnosis: Acute pain of left shoulder  Other symptoms and signs involving the musculoskeletal system  Muscle weakness (generalized)  Abnormal posture     Problem List Patient Active Problem List   Diagnosis Date Noted  . Herpes simplex type 1 infection 12/01/2013  . Cervical sympathetic dystrophy 12/01/2013  . FO (foramen ovale) 12/01/2013  . Renal insufficiency, mild 12/01/2013  . 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 CAFL (chronic airflow limitation) (HCC) 09/27/2013  . Urinary system disease 07/18/2013  . Bilateral polycystic ovarian syndrome 07/18/2013  . Polycystic ovaries 07/18/2013  . ASD (atrial septal defect), ostium secundum  07/18/2013    Treshaun Carrico Rober Minion PT, MPH  04/12/2018, 4:58 PM  [facility] [address] [place], [place], [address] Phone: [phone]   Fax:  [phone]  Name: ZAVIA PULLEN MRN: [id number] Date of  Birth: 11/01/1955

## 2018-04-20 ENCOUNTER — Encounter: Payer: Self-pay | Admitting: Rehabilitative and Restorative Service Providers"

## 2018-04-20 ENCOUNTER — Ambulatory Visit (INDEPENDENT_AMBULATORY_CARE_PROVIDER_SITE_OTHER): Payer: Commercial Managed Care - PPO | Admitting: Rehabilitative and Restorative Service Providers"

## 2018-04-20 DIAGNOSIS — M25512 Pain in left shoulder: Secondary | ICD-10-CM

## 2018-04-20 DIAGNOSIS — M6281 Muscle weakness (generalized): Secondary | ICD-10-CM

## 2018-04-20 DIAGNOSIS — R29898 Other symptoms and signs involving the musculoskeletal system: Secondary | ICD-10-CM

## 2018-04-20 DIAGNOSIS — R293 Abnormal posture: Secondary | ICD-10-CM

## 2018-04-20 NOTE — Therapy (Signed)
[facility] [place] [address] [place], [place], [address] Phone: [phone]   Fax:  [phone]  Physical Therapy Treatment  Patient Details  Name: Mary Trujillo MRN: [id number] Date of Birth: 05/19/1955 Referring Provider (PT): Dr Jene EveryJeffrey Beane   Encounter Date: 04/20/2018  PT End of Session - 04/20/18 1627    Visit Number  18    Number of Visits  18    Date for PT Re-Evaluation  04/19/18    PT Start Time  1625    PT Stop Time  1721    PT Time Calculation (min)  56 min    Activity Tolerance  Patient tolerated treatment well       Past Medical History:  Diagnosis Date  . Abnormal pap 2005   CIN 1- Colpo CIN1, Neg ECC  . Ankle fracture   . Anxiety   . Colitis   . COPD (chronic obstructive pulmonary disease) (HCC) 4/15   had spirometry   . Hirsutism   . History of PCOS   . Hx of migraines   . Osteopenia     Past Surgical History:  Procedure Laterality Date  . APPENDECTOMY    . CATARACT EXTRACTION, BILATERAL Bilateral 04/2017  . PARTIAL COLECTOMY Right 12/2002   with appendectomy    There were no vitals filed for this visit.      [facility] - 04/20/18 0001      Assessment   Medical Diagnosis  Fracture Lt proximal humerus    Referring Provider (PT)  Dr Jene EveryJeffrey Beane    Onset Date/Surgical Date  12/08/17    Hand Dominance  Right    Next MD Visit  PRN      Observation/Other Assessments   Focus on Therapeutic Outcomes (FOTO)   28% limitation       PROM   Left Shoulder Flexion  155 Degrees    Left Shoulder ABduction  161 Degrees    Left Shoulder Internal Rotation  62 Degrees    Left Shoulder External Rotation  96 Degrees      Palpation   Palpation comment  improved muscular tightness through the Lt upper quarter - pecs; upper trap; leveator; teres; biceps; triceps                    [facility] Adult PT Treatment/Exercise - 04/20/18 0001      Shoulder Exercises:  ROM/Strengthening   UBE (Upper Arm Bike)  L3: 1 min forward/ 1 min backward      Shoulder Exercises: Stretch   Other Shoulder Stretches  3 way doorway stretch 30 sec x 3 each position     Other Shoulder Stretches  lat stretch holding sink and walking backwards x 20 sec x 2; reaching up bilat arms up cabinet for shoulder stretch x 15 sec       Moist Heat Therapy   Number Minutes Moist Heat  20 Minutes    Moist Heat Location  Shoulder      Electrical Stimulation   Electrical Stimulation Location  Lt shoulder girdle     Electrical Stimulation Action  IFC    Electrical Stimulation Parameters  to tolerance    Electrical Stimulation Goals  Pain;Tone      Manual Therapy   Joint Mobilization  inferior mobs 90 deg abd with stretch 10-20 sec hold x 5-7 reps     Soft tissue mobilization  deep tissue work therough the Lt shoulder girdle     Myofascial  Release  Lt pec release (point tender pec minor)     Passive ROM  PROM/stretch into ER and flexion                   PT Long Term Goals - 03/08/18 1634      PT LONG TERM GOAL #1   Title  Improve posture and alignment with patient demonstrating improved upright posture with posterior shoulder girdle engaged 04/19/18    Time  12    Period  Weeks    Status  Revised      PT LONG TERM GOAL #2   Title  Increase AROM Lt shoulder to within 5-10 degrees of AROM Rt shoulder 04/19/18    Time  12    Period  Weeks    Status  Revised      PT LONG TERM GOAL #3   Title  Increase functional activities with patient using Lt UE to lift items such as groceries and clothing; reaching into closets/cabinets for light weight items with minimal to no pain 03/06/18    Time  6    Period  Weeks    Status  Achieved      PT LONG TERM GOAL #4   Title  Independent in HEP 04/19/18    Time  12    Period  Weeks    Status  Revised      PT LONG TERM GOAL #5   Title  Improve FOTO to </= 37% limitation 03/06/18    Time  6    Period  Weeks    Status   Achieved            Plan - 04/20/18 1701    Clinical Impression Statement  Progressing well with shoulder rehab. More functional mobility and ROM with less pain. Excellent progress with PROM with less pain with end range stretches.     Rehab Potential  Good    Clinical Impairments Affecting Rehab Potential  forward posture; abnormal movement patterns Lt UE; muscular imbalance; muscular tightness     PT Frequency  2x / week    PT Duration  6 weeks    PT Treatment/Interventions  Patient/family education;ADLs/Self Care Home Management;Cryotherapy;Electrical Stimulation;Iontophoresis 4mg /ml Dexamethasone;Moist Heat;Ultrasound;Dry needling;Manual techniques;Neuromuscular re-education;Therapeutic activities;Therapeutic exercise    PT Next Visit Plan  continue progressive ROM and strengthening of Lt shoulder.     PT Home Exercise Plan  self massage with ball to Lt teres and pec     Consulted and Agree with Plan of Care  Patient       Patient will benefit from skilled therapeutic intervention in order to improve the following deficits and impairments:  Postural dysfunction, Improper body mechanics, Pain, Increased fascial restricitons, Increased muscle spasms, Decreased mobility, Decreased range of motion, Decreased strength, Decreased activity tolerance  Visit Diagnosis: Acute pain of left shoulder  Other symptoms and signs involving the musculoskeletal system  Muscle weakness (generalized)  Abnormal posture     Problem List Patient Active Problem List   Diagnosis Date Noted  . Herpes simplex type 1 infection 12/01/2013  . Cervical sympathetic dystrophy 12/01/2013  . FO (foramen ovale) 12/01/2013  . Renal insufficiency, mild 12/01/2013  . 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 CAFL (chronic airflow limitation) (HCC) 09/27/2013  . Urinary system disease 07/18/2013  . Bilateral polycystic ovarian syndrome 07/18/2013  . Polycystic ovaries 07/18/2013  . ASD (atrial septal defect),  ostium secundum 07/18/2013     Rober MinionP  PT, MPH  04/20/2018, 5:06 PM  [facility] [place] [address] [place], [place], [address] Phone: [phone]   Fax:  [phone]  Name: Mary Trujillo MRN: [id number] Date of Birth: 10/29/1955

## 2018-04-27 ENCOUNTER — Ambulatory Visit (INDEPENDENT_AMBULATORY_CARE_PROVIDER_SITE_OTHER): Payer: Commercial Managed Care - PPO | Admitting: Rehabilitative and Restorative Service Providers"

## 2018-04-27 ENCOUNTER — Encounter: Payer: Self-pay | Admitting: Rehabilitative and Restorative Service Providers"

## 2018-04-27 DIAGNOSIS — M6281 Muscle weakness (generalized): Secondary | ICD-10-CM

## 2018-04-27 DIAGNOSIS — R293 Abnormal posture: Secondary | ICD-10-CM

## 2018-04-27 DIAGNOSIS — M25512 Pain in left shoulder: Secondary | ICD-10-CM | POA: Diagnosis not present

## 2018-04-27 DIAGNOSIS — R29898 Other symptoms and signs involving the musculoskeletal system: Secondary | ICD-10-CM

## 2018-04-27 NOTE — Therapy (Signed)
[facility] [place] [address], [place], [address] Phone: [phone]   Fax:  [phone]  Physical Therapy Treatment  Patient Details  Name: Mary Trujillo MRN: [id number] Date of Birth: 1955/08/05 Referring Provider (PT): Dr Susa Day   Encounter Date: 04/27/2018  PT End of Session - 04/27/18 1157    Visit Number  19    Number of Visits  20    Date for PT Re-Evaluation  05/01/18    PT Start Time  1146    PT Stop Time  1245    PT Time Calculation (min)  59 min    Activity Tolerance  Patient tolerated treatment well       Past Medical History:  Diagnosis Date  . Abnormal pap 2005   CIN 1- Colpo CIN1, Neg ECC  . Ankle fracture   . Anxiety   . Colitis   . COPD (chronic obstructive pulmonary disease) ([place]) 4/15   had spirometry   . Hirsutism   . History of PCOS   . Hx of migraines   . Osteopenia     Past Surgical History:  Procedure Laterality Date  . APPENDECTOMY    . CATARACT EXTRACTION, BILATERAL Bilateral 04/2017  . PARTIAL COLECTOMY Right 12/2002   with appendectomy    There were no vitals filed for this visit.  Subjective Assessment - 04/27/18 1158    Subjective  Patient reports that her shoulder is feeling good. No pain. ROM is increasing. Feels confident in continuing with independent HEP. Plaeased with progress. Could lift 23 pound grandson.     Currently in Pain?  No/denies         [facility] PT Assessment - 04/27/18 0001      Assessment   Medical Diagnosis  Fracture Lt proximal humerus    Referring Provider (PT)  Dr Susa Day    Onset Date/Surgical Date  12/08/17    Hand Dominance  Right    Next MD Visit  PRN      AROM   Right Shoulder Extension  77 Degrees    Right Shoulder Flexion  154 Degrees    Right Shoulder ABduction  150 Degrees    Right Shoulder Internal Rotation  35 Degrees    Right Shoulder External Rotation  96 Degrees    Left Shoulder Extension  54 Degrees     Left Shoulder Flexion  144 Degrees    Left Shoulder ABduction  146 Degrees    Left Shoulder Internal Rotation  34 Degrees    Left Shoulder External Rotation  92 Degrees      PROM   Left Shoulder Extension  90 Degrees    Left Shoulder Flexion  165 Degrees    Left Shoulder ABduction  165 Degrees    Left Shoulder Internal Rotation  62 Degrees    Left Shoulder External Rotation  96 Degrees      Strength   Overall Strength Comments  WFL's bilat UE's                    [facility] Adult PT Treatment/Exercise - 04/27/18 0001      Shoulder Exercises: Standing   Other Standing Exercises  Lt shoulder flexion to 90 and 120 deg ball on ball at dorsum of hand       Shoulder Exercises: Therapy Ball   Other Therapy Ball Exercises  stepping under large ball for shd flexion 30 sec x 3       Shoulder  Exercises: ROM/Strengthening   UBE (Upper Arm Bike)  L3: 1 min forward/ 1 min backward x 2 each direction       Shoulder Exercises: Stretch   Other Shoulder Stretches  3 way doorway stretch 30 sec x 3 each position     Other Shoulder Stretches  stepping under large ball - for stretch into flexion 20sec hold x 5 reps       Moist Heat Therapy   Number Minutes Moist Heat  20 Minutes    Moist Heat Location  Shoulder      Electrical Stimulation   Electrical Stimulation Location  Lt shoulder girdle     Electrical Stimulation Action  IFC    Electrical Stimulation Parameters  to tolerance    Electrical Stimulation Goals  Pain;Tone      Manual Therapy   Joint Mobilization  inferior mobs 90 deg abd with stretch 10-20 sec hold x 5-7 reps     Soft tissue mobilization  deep tissue work therough the Lt shoulder girdle     Myofascial Release  Lt pec release (point tender pec minor)     Passive ROM  PROM/stretch into ER and flexion                   PT Long Term Goals - 04/27/18 1341      PT LONG TERM GOAL #1   Title  Improve posture and alignment with patient demonstrating improved  upright posture with posterior shoulder girdle engaged 04/19/18    Time  12    Period  Weeks    Status  Achieved      PT LONG TERM GOAL #2   Title  Increase AROM Lt shoulder to within 5-10 degrees of AROM Rt shoulder 04/19/18    Time  12    Period  Weeks    Status  Achieved      PT LONG TERM GOAL #3   Time  6    Period  Weeks    Status  Achieved      PT LONG TERM GOAL #4   Title  Independent in HEP 04/19/18    Time  12    Period  Weeks    Status  Achieved      PT LONG TERM GOAL #5   Title  Improve FOTO to </= 37% limitation 03/06/18    Time  6    Period  Weeks    Status  Achieved            Plan - 04/27/18 1157    Clinical Impression Statement  Excellent progress overall.  Strength WFL's. AROM Lt shoulder nearing Rt throughout. Patient has returned to all normal functional activities with no pain. Goasl of therapy accomplished. D/c to Ind HEP     Rehab Potential  Good    Clinical Impairments Affecting Rehab Potential  forward posture; abnormal movement patterns Lt UE; muscular imbalance; muscular tightness     PT Frequency  2x / week    PT Duration  6 weeks    PT Treatment/Interventions  Patient/family education;ADLs/Self Care Home Management;Cryotherapy;Electrical Stimulation;Iontophoresis '4mg'$ /ml Dexamethasone;Moist Heat;Ultrasound;Dry needling;Manual techniques;Neuromuscular re-education;Therapeutic activities;Therapeutic exercise    PT Next Visit Plan  D/C pt will call with any questions or problems     PT Home Exercise Plan  self massage with ball to Lt teres and pec     Consulted and Agree with Plan of Care  Patient       Patient will benefit from  skilled therapeutic intervention in order to improve the following deficits and impairments:  Postural dysfunction, Improper body mechanics, Pain, Increased fascial restricitons, Increased muscle spasms, Decreased mobility, Decreased range of motion, Decreased strength, Decreased activity tolerance  Visit  Diagnosis: Acute pain of left shoulder - Plan: PT plan of care cert/re-cert  Other symptoms and signs involving the musculoskeletal system - Plan: PT plan of care cert/re-cert  Muscle weakness (generalized) - Plan: PT plan of care cert/re-cert  Abnormal posture - Plan: PT plan of care cert/re-cert     Problem List Patient Active Problem List   Diagnosis Date Noted  . Herpes simplex type 1 infection 12/01/2013  . Cervical sympathetic dystrophy 12/01/2013  . FO (foramen ovale) 12/01/2013  . Renal insufficiency, mild 12/01/2013  . 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 CAFL (chronic airflow limitation) ([place]) 09/27/2013  . Urinary system disease 07/18/2013  . Bilateral polycystic ovarian syndrome 07/18/2013  . Polycystic ovaries 07/18/2013  . ASD (atrial septal defect), ostium secundum 07/18/2013    Celyn Nilda Simmer PT, MPH  04/27/2018, 1:45 PM  [facility] [place], [place], [address] Phone: [phone]   Fax:  [phone]  Name: Mary Trujillo MRN: [id number] Date of Birth: [birth year]  PHYSICAL THERAPY DISCHARGE SUMMARY  Visits from Start of Care: 19  Current functional level related to goals / functional outcomes: See discharge note above    Remaining deficits: Mild end range limitation Lt shoulder - should continue with HEP - will call with any questions or problems    Education / Equipment: HEP  Plan: Patient agrees to discharge.  Patient goals were met. Patient is being discharged due to meeting the stated rehab goals.  ?????    Celyn P. Helene Kelp PT, MPH 04/27/18 1:46 PM

## 2018-05-19 ENCOUNTER — Ambulatory Visit
Admission: RE | Admit: 2018-05-19 | Discharge: 2018-05-19 | Disposition: A | Discharge status: Home / Self Care | Payer: Commercial Managed Care - PPO | Source: Ambulatory Visit | Attending: Obstetrics & Gynecology | Admitting: Obstetrics & Gynecology

## 2018-05-19 DIAGNOSIS — Z1231 Encounter for screening mammogram for malignant neoplasm of breast: Secondary | ICD-10-CM

## 2018-07-14 ENCOUNTER — Ambulatory Visit (INDEPENDENT_AMBULATORY_CARE_PROVIDER_SITE_OTHER): Payer: Commercial Managed Care - PPO | Admitting: Obstetrics & Gynecology

## 2018-07-14 ENCOUNTER — Other Ambulatory Visit: Payer: Self-pay

## 2018-07-14 ENCOUNTER — Encounter: Payer: Self-pay | Admitting: Obstetrics & Gynecology

## 2018-07-14 VITALS — BP 120/70 | HR 76 | Resp 16 | Ht 64.0 in | Wt 117.6 lb

## 2018-07-14 DIAGNOSIS — Z01419 Encounter for gynecological examination (general) (routine) without abnormal findings: Secondary | ICD-10-CM

## 2018-07-14 DIAGNOSIS — Z205 Contact with and (suspected) exposure to viral hepatitis: Secondary | ICD-10-CM

## 2018-07-14 NOTE — Progress Notes (Signed)
63 y.o. G0P0000 Married White or Caucasian female here for annual exam.  She is doing well.  Lucila Maine is doing well.  He's walking.  They go to [place] three to four times a year.  Her husband's mother passed this past year and his sister passed last year due to ALS.  This was hard for them to see and was sad.    Denies vaginal bleeding.     Fractured her humerus with ice skating.  She did not have to have surgery.  It has healed and PT has helped her regain her full range of motion.    Patient's last menstrual period was 05/03/2008.          Sexually active: No.  The current method of family planning is post menopausal status.    Exercising: No.   Smoker:  no  Health Maintenance: Pap:  05/13/17 Neg   01/29/16 Neg. HR HPV:neg  History of abnormal Pap:  Yes, remote hx MMG:  05/19/18 BIRADS1:Neg  Colonoscopy:  11/12/14.  had sigmoidoscopy  BMD:   07/26/17 Osteopenia TDaP:  05/12/2017 Pneumonia vaccine(s):  n/a Shingrix:   Considering having this done Hep C testing: unsure  Screening Labs: PCP   reports that she has never smoked. She has never used smokeless tobacco. She reports previous alcohol use. She reports that she does not use drugs.  Past Medical History:  Diagnosis Date  . Abnormal pap 2005   CIN 1- Colpo CIN1, Neg ECC  . Ankle fracture   . Anxiety   . Arm fracture, left 2019  . Colitis   . COPD (chronic obstructive pulmonary disease) (HCC) 4/15   had spirometry   . Hirsutism   . History of PCOS   . Hx of migraines   . Osteopenia     Past Surgical History:  Procedure Laterality Date  . APPENDECTOMY    . CATARACT EXTRACTION, BILATERAL Bilateral 04/2017  . PARTIAL COLECTOMY Right 12/2002   with appendectomy    Current Outpatient Medications  Medication Sig Dispense Refill  . alclomethasone (ACLOVATE) 0.05 % ointment TAKE ONE APPLICATIONS TOPICAL TWO TIMES A DAY    . aspirin 81 MG tablet 81 mg. Take one daily    . B Complex Vitamins (VITAMIN-B COMPLEX) TABS  Take by mouth.    . Calcium Carb-Cholecalciferol (CALCIUM 1000 + D PO) Take by mouth.    . FIBER PO Take by mouth 2 (two) times daily.    Marland Kitchen FLUoxetine (PROZAC) 10 MG tablet TAKE 1 TABLET BY MOUTH EVERY DAY    . L-Lysine 500 MG TABS Take by mouth.    . Multiple Vitamins-Minerals (PRESERVISION AREDS PO) Take by mouth daily.    Marland Kitchen terbinafine (LAMISIL) 1 % cream Apply topically 1-2x/day    . TURMERIC PO Take by mouth daily.     No current facility-administered medications for this visit.     Family History  Problem Relation Age of Onset  . Cancer - Ovarian Mother 67  . Cancer - Colon Mother 38  . Hypertension Father   . Diabetes Paternal Grandfather   . Heart disease Maternal Grandfather   . Colonic polyp Sister        adenosarcoma in the polyp, did not have colon resection  . Colon cancer Cousin 65       paternal cousin    Review of Systems  Genitourinary:       Vulvar lumps  All other systems reviewed and are negative.   Exam:   BP  120/70 (BP Location: Right Arm, Patient Position: Sitting, Cuff Size: Normal)   Pulse 76   Resp 16   Ht 5\' 4"  (1.626 m)   Wt 117 lb 9.6 oz (53.3 kg)   LMP 05/03/2008   BMI 20.19 kg/m    Height: 5\' 4"  (162.6 cm)  Ht Readings from Last 3 Encounters:  07/14/18 5\' 4"  (1.626 m)  05/13/17 5' 3.75" (1.619 m)  01/29/16 5' 3.75" (1.619 m)    General appearance: alert, cooperative and appears stated age Head: Normocephalic, without obvious abnormality, atraumatic Neck: no adenopathy, supple, symmetrical, trachea midline and thyroid normal to inspection and palpation Lungs: clear to auscultation bilaterally Breasts: normal appearance, no masses or tenderness Heart: regular rate and rhythm Abdomen: soft, non-tender; bowel sounds normal; no masses,  no organomegaly Extremities: extremities normal, atraumatic, no cyanosis or edema Skin: Skin color, texture, turgor normal. No rashes or lesions Lymph nodes: Cervical, supraclavicular, and axillary nodes  normal. No abnormal inguinal nodes palpated Neurologic: Grossly normal   Pelvic: External genitalia:  no lesions              Urethra:  normal appearing urethra with no masses, tenderness or lesions              Bartholins and Skenes: normal                 Vagina: normal appearing vagina with normal color and discharge, no lesions              Cervix: no lesions              Pap taken: No. Bimanual Exam:  Uterus:  normal size, contour, position, consistency, mobility, non-tender              Adnexa: normal adnexa and no mass, fullness, tenderness               Rectovaginal: Confirms               Anus:  normal sphincter tone, no lesions  Chaperone was present for exam.  A:  Well Woman with normal exam PMP, no HRT Mild renal insuffiencey due to spironolactone, renal function has normalized Vaginal atrophy changes Family hx fo colon cancer/adenomatous polyps (mother and sister).  Sigmoidoscopy (at [facility] and is in Epic). Done 7/16 after bowel obstruction Anxiety, on fluoxetine   P:   Mammogram guidelines reviewed pap smear obtained 2019.  No pap obtained today. MMG up to date Lab work done with Dr. Rikki Spearing Colonoscopy is due.  Pt is going to do this next year. Hep c antibody obtained today Shingrix vaccination discussed return annually or prn

## 2018-07-14 NOTE — Patient Instructions (Signed)
Zoster Vaccine, Recombinant injection  What is this medicine?  ZOSTER VACCINE (ZOS ter vak SEEN) is used to prevent shingles in adults 62 years old and over. This vaccine is not used to treat shingles or nerve pain from shingles.  This medicine may be used for other purposes; ask your health care provider or pharmacist if you have questions.  COMMON BRAND NAME(S): SHINGRIX  What should I tell my health care provider before I take this medicine?  They need to know if you have any of these conditions:  -blood disorders or disease  -cancer like leukemia or lymphoma  -immune system problems or therapy  -an unusual or allergic reaction to vaccines, other medications, foods, dyes, or preservatives  -pregnant or trying to get pregnant  -breast-feeding  How should I use this medicine?  This vaccine is for injection in a muscle. It is given by a health care professional.  Talk to your pediatrician regarding the use of this medicine in children. This medicine is not approved for use in children.  Overdosage: If you think you have taken too much of this medicine contact a poison control center or emergency room at once.  NOTE: This medicine is only for you. Do not share this medicine with others.  What if I miss a dose?  Keep appointments for follow-up (booster) doses as directed. It is important not to miss your dose. Call your doctor or health care professional if you are unable to keep an appointment.  What may interact with this medicine?  -medicines that suppress your immune system  -medicines to treat cancer  -steroid medicines like prednisone or cortisone  This list may not describe all possible interactions. Give your health care provider a list of all the medicines, herbs, non-prescription drugs, or dietary supplements you use. Also tell them if you smoke, drink alcohol, or use illegal drugs. Some items may interact with your medicine.  What should I watch for while using this medicine?  Visit your doctor for regular  check ups.  This vaccine, like all vaccines, may not fully protect everyone.  What side effects may I notice from receiving this medicine?  Side effects that you should report to your doctor or health care professional as soon as possible:  -allergic reactions like skin rash, itching or hives, swelling of the face, lips, or tongue  -breathing problems  Side effects that usually do not require medical attention (report these to your doctor or health care professional if they continue or are bothersome):  -chills  -headache  -fever  -nausea, vomiting  -redness, warmth, pain, swelling or itching at site where injected  -tiredness  This list may not describe all possible side effects. Call your doctor for medical advice about side effects. You may report side effects to FDA at 1-800-FDA-1088.  Where should I keep my medicine?  This vaccine is only given in a clinic, pharmacy, doctor's office, or other health care setting and will not be stored at home.  NOTE: This sheet is a summary. It may not cover all possible information. If you have questions about this medicine, talk to your doctor, pharmacist, or health care provider.   2019 Elsevier/Gold Standard (2016-11-29 13:20:30)

## 2018-07-15 LAB — HEPATITIS C ANTIBODY: Hep C Virus Ab: <0.1 s/co ratio (ref 0.0–0.9)

## 2019-01-11 DIAGNOSIS — Z23 Encounter for immunization: Secondary | ICD-10-CM | POA: Diagnosis not present

## 2019-01-11 DIAGNOSIS — H43812 Vitreous degeneration, left eye: Secondary | ICD-10-CM | POA: Diagnosis not present

## 2019-01-11 DIAGNOSIS — H35372 Puckering of macula, left eye: Secondary | ICD-10-CM | POA: Diagnosis not present

## 2019-01-11 DIAGNOSIS — Z961 Presence of intraocular lens: Secondary | ICD-10-CM | POA: Diagnosis not present

## 2019-05-10 DIAGNOSIS — M79641 Pain in right hand: Secondary | ICD-10-CM | POA: Diagnosis not present

## 2019-05-10 DIAGNOSIS — R69 Illness, unspecified: Secondary | ICD-10-CM | POA: Diagnosis not present

## 2019-05-10 DIAGNOSIS — M79642 Pain in left hand: Secondary | ICD-10-CM | POA: Diagnosis not present

## 2019-05-10 DIAGNOSIS — M85852 Other specified disorders of bone density and structure, left thigh: Secondary | ICD-10-CM | POA: Diagnosis not present

## 2019-07-18 ENCOUNTER — Other Ambulatory Visit: Payer: Self-pay | Admitting: Obstetrics & Gynecology

## 2019-07-18 DIAGNOSIS — Z1231 Encounter for screening mammogram for malignant neoplasm of breast: Secondary | ICD-10-CM

## 2019-08-06 ENCOUNTER — Ambulatory Visit: Payer: Commercial Managed Care - PPO

## 2019-08-07 ENCOUNTER — Ambulatory Visit
Admission: RE | Admit: 2019-08-07 | Discharge: 2019-08-07 | Disposition: A | Discharge status: Home / Self Care | Payer: Commercial Managed Care - PPO | Source: Ambulatory Visit | Attending: Obstetrics & Gynecology | Admitting: Obstetrics & Gynecology

## 2019-08-07 ENCOUNTER — Other Ambulatory Visit: Payer: Self-pay

## 2019-08-07 DIAGNOSIS — Z1231 Encounter for screening mammogram for malignant neoplasm of breast: Secondary | ICD-10-CM

## 2019-11-21 IMAGING — MG DIGITAL SCREENING BILATERAL MAMMOGRAM WITH TOMO AND CAD
8 series · 8 of 24 positions shown · non-contrast
Comparison: Previous exam(s).

CLINICAL DATA: Screening.

EXAM:
DIGITAL SCREENING BILATERAL MAMMOGRAM WITH TOMO AND CAD

[R MLO synth-2D]
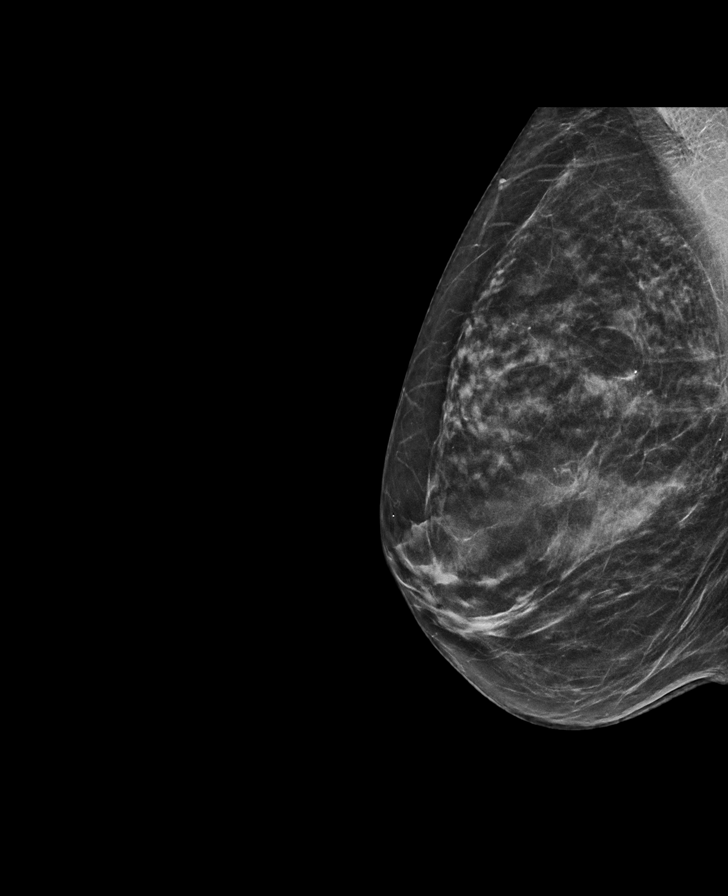

[R CC synth-2D]
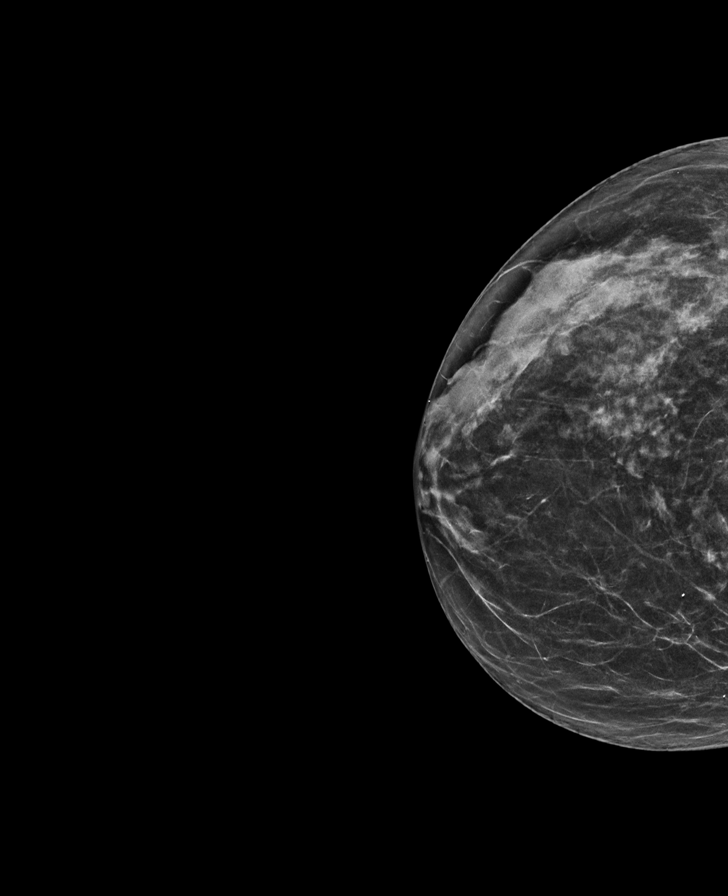

[L CC synth-2D]
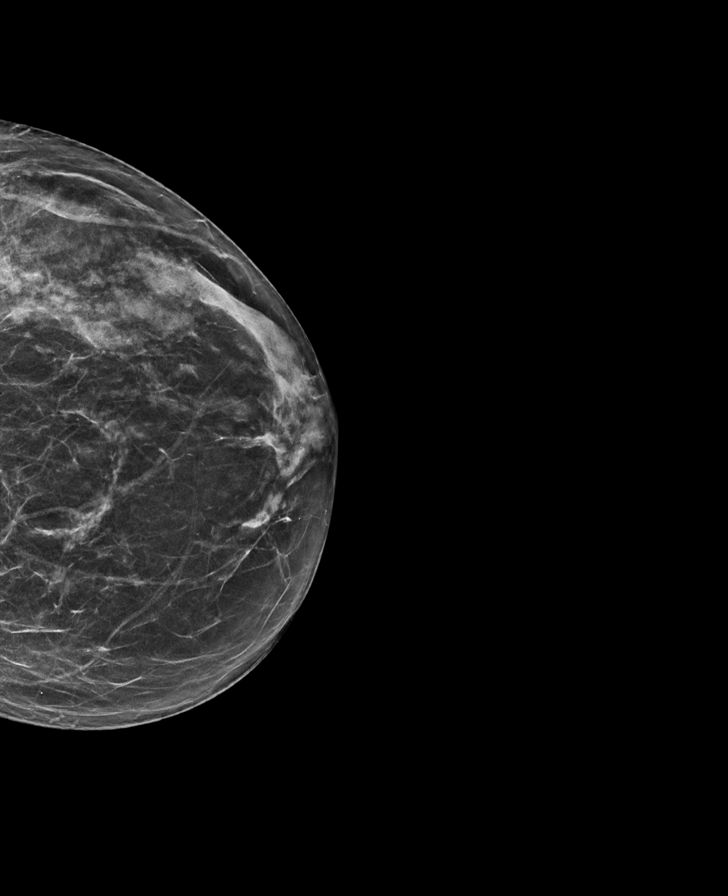

[L MLO synth-2D]
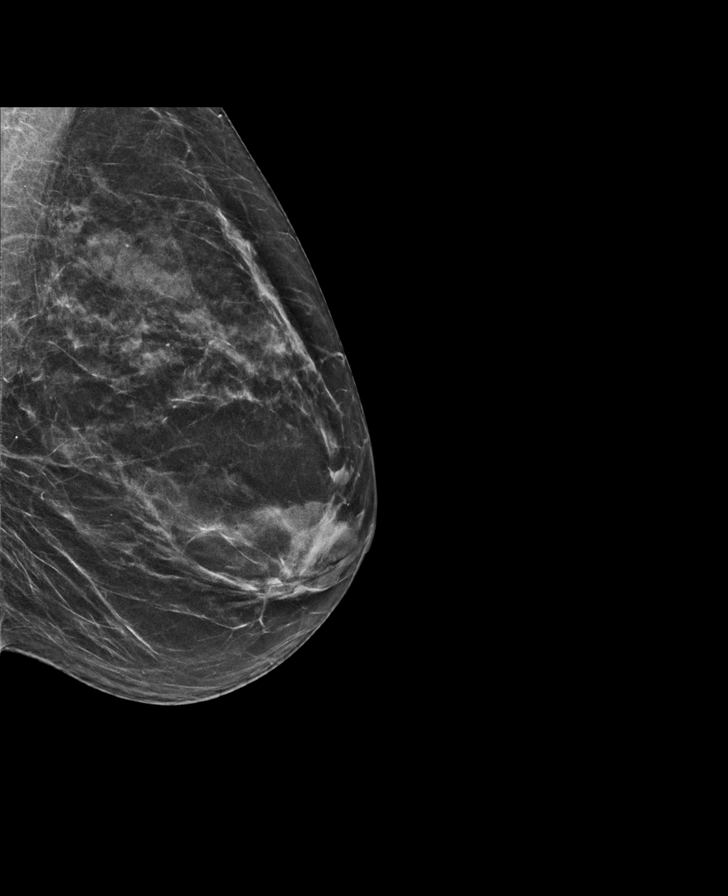

[L MLO tomo · tomo slice 31/62.0]
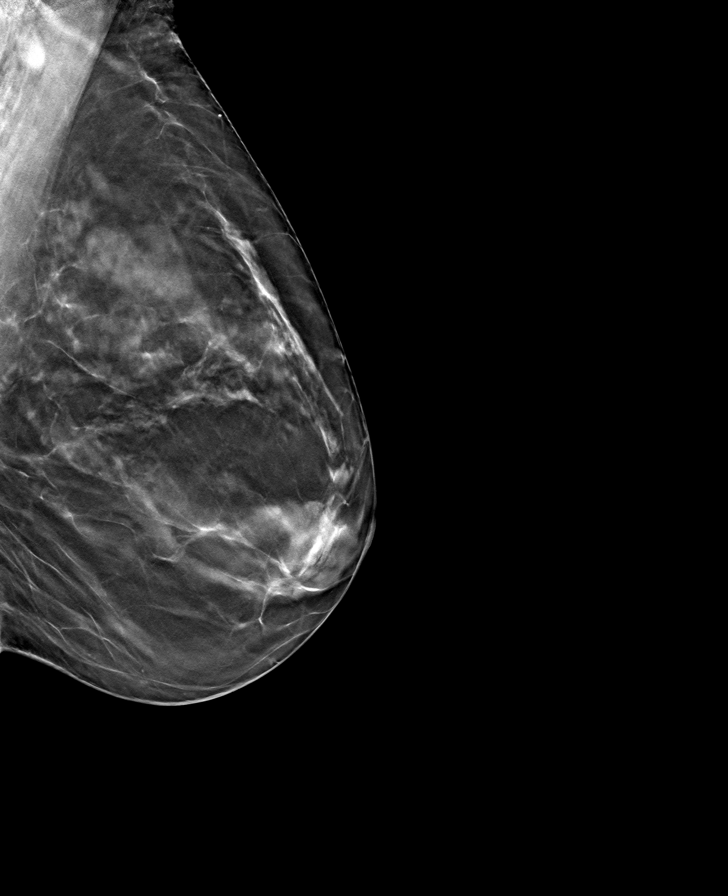

[R MLO tomo · tomo slice 31/62.0]
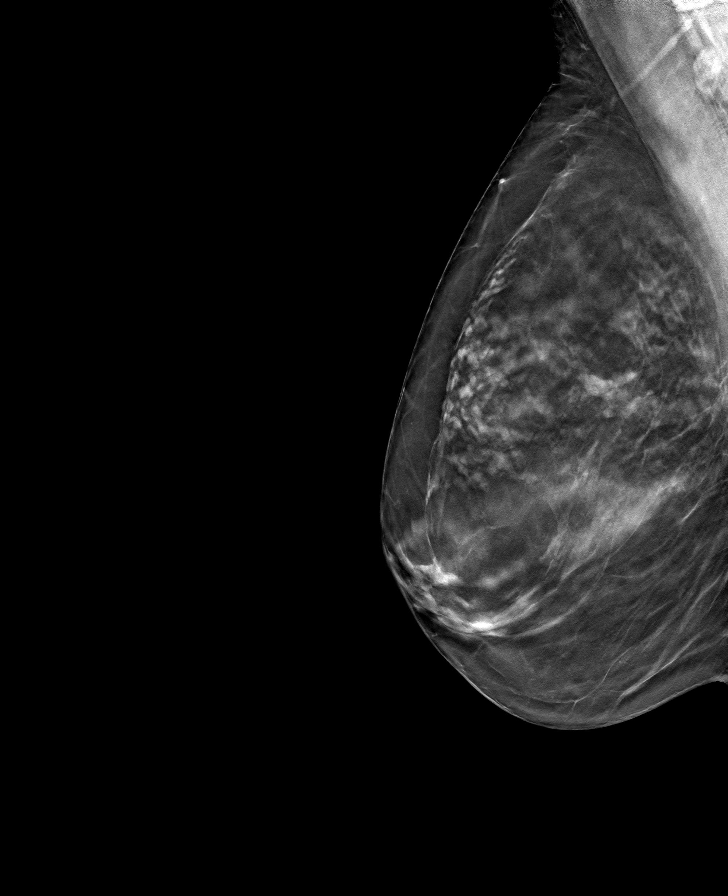

[L CC tomo · tomo slice 31/61.0]
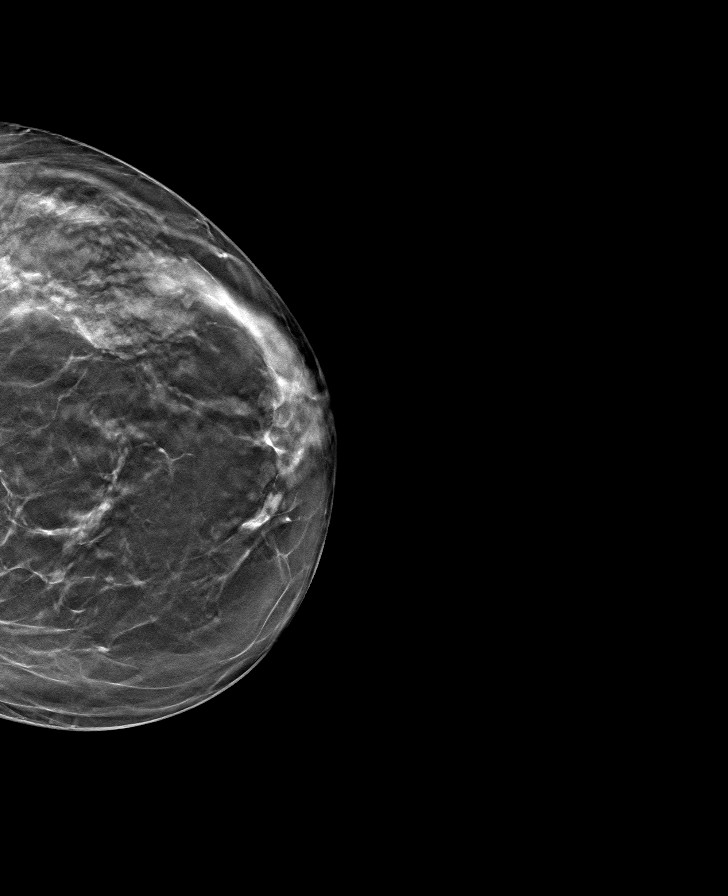

[R CC tomo · tomo slice 29/57.0]
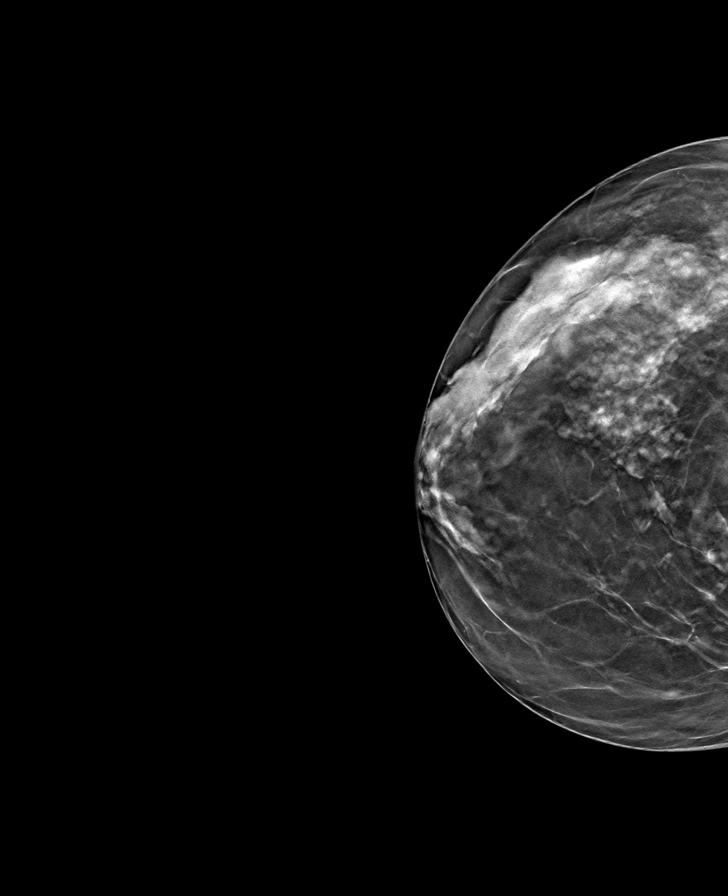

[8 of 24 positions shown; findings below may reference images not displayed]

ACR Breast Density Category c: The breast tissue is heterogeneously
dense, which may obscure small masses.
FINDINGS: There are no findings suspicious for malignancy. Images were
processed with CAD.
IMPRESSION: No mammographic evidence of malignancy. A result letter of this
screening mammogram will be mailed directly to the patient.

RECOMMENDATION:
Screening mammogram in one year. (Code:FT-U-LHB)

BI-RADS CATEGORY  1: Negative.

## 2019-11-22 NOTE — Progress Notes (Addendum)
64 y.o. G0P0000 Married White or Caucasian female here for annual exam.  Doing well.  Denies vaginal bleeding.  Had a second grandson born this year.  They are both in [place].    Pt is having more issues with her hands.  Distal and middle joint is swollen on both hands.  Right is worse than left.  Feels like two fingers on her right hand are twisting.  Saw [facility] provider in January.  ANA and Anti-CCp done 05/10/2019 and these were negative.  Had Vit D and CMP obtained as well.  No additional recommendations were made.  Would like to see rheumatologist.  Father had hx of psoriatic arthritis and MGF has rheumatoid arthritis.  Patient's last menstrual period was 05/03/2008.          Sexually active: Yes.    The current method of family planning is post menopausal status.    Exercising: No.  The patient does not participate in regular exercise at present. Smoker:  no  Health Maintenance: Pap: 01-29-16 neg HPV HR neg, 05-13-17 neg History of abnormal Pap:  yes MMG:  08-07-2019 category c density birads 1:neg Colonoscopy:  11-12-14 had sigmoidoscopy BMD:   07-26-17 osteopenia TDaP:  2019  Pneumonia vaccine(s):  Thinks she may have done pneumovax Shingrix:   D/w pt today.  She is going to do this at some point.   Hep C testing: neg 07-14-2018 Screening Labs: discuss with provider    reports that she has never smoked. She has never used smokeless tobacco. She reports previous alcohol use. She reports that she does not use drugs.  Past Medical History:  Diagnosis Date  . Abnormal pap 2005   CIN 1- Colpo CIN1, Neg ECC  . Ankle fracture   . Anxiety   . Arm fracture, left 2019  . Colitis   . COPD (chronic obstructive pulmonary disease) (HCC) 4/15   had spirometry   . Hirsutism   . History of PCOS   . Hx of migraines   . Osteopenia     Past Surgical History:  Procedure Laterality Date  . APPENDECTOMY    . CATARACT EXTRACTION, BILATERAL Bilateral 04/2017  . PARTIAL COLECTOMY Right  12/2002   with appendectomy    Current Outpatient Medications  Medication Sig Dispense Refill  . alclomethasone (ACLOVATE) 0.05 % ointment TAKE ONE APPLICATIONS TOPICAL TWO TIMES A DAY    . aspirin 81 MG tablet 81 mg. Take one daily    . B Complex Vitamins (VITAMIN-B COMPLEX) TABS Take by mouth.    . Calcium Carb-Cholecalciferol (CALCIUM 1000 + D PO) Take by mouth.    . COLLAGEN PO Take by mouth.    . FIBER PO Take by mouth 2 (two) times daily.    Marland Kitchen FLUoxetine (PROZAC) 10 MG tablet TAKE 1 TABLET BY MOUTH EVERY DAY    . hydrocortisone 2.5 % cream Apply topically.    Marland Kitchen L-Lysine 500 MG TABS Take by mouth.    . Multiple Vitamins-Minerals (PRESERVISION AREDS PO) Take by mouth daily.    Marland Kitchen terbinafine (LAMISIL) 1 % cream Apply topically 1-2x/day    . TURMERIC PO Take by mouth daily.     No current facility-administered medications for this visit.    Family History  Problem Relation Age of Onset  . Cancer - Ovarian Mother 26  . Cancer - Colon Mother 71  . Hypertension Father   . Diabetes Paternal Grandfather   . Heart disease Maternal Grandfather   . Colonic polyp  Sister        adenosarcoma in the polyp, did not have colon resection  . Colon cancer Cousin 74       paternal cousin  . Prostate cancer Brother     Review of Systems  All other systems reviewed and are negative.   Exam:   BP 126/76 (BP Location: Right Arm, Patient Position: Sitting, Cuff Size: Normal)   Pulse 70   Resp 14   Ht 5\' 4"  (1.626 m)   Wt 124 lb 6.4 oz (56.4 kg)   LMP 05/03/2008   BMI 21.35 kg/m   Height: 5\' 4"  (162.6 cm)  General appearance: alert, cooperative and appears stated age Head: Normocephalic, without obvious abnormality, atraumatic Neck: no adenopathy, supple, symmetrical, trachea midline and thyroid normal to inspection and palpation Lungs: clear to auscultation bilaterally Breasts: normal appearance, no masses or tenderness Heart: regular rate and rhythm Abdomen: soft, non-tender; bowel  sounds normal; no masses,  no organomegaly Extremities: extremities normal, atraumatic, no cyanosis or edema Skin: Skin color, texture, turgor normal. No rashes or lesions Lymph nodes: Cervical, supraclavicular, and axillary nodes normal. No abnormal inguinal nodes palpated Neurologic: Grossly normal   Pelvic: External genitalia:  no lesions              Urethra:  normal appearing urethra with no masses, tenderness or lesions              Bartholins and Skenes: normal                 Vagina: atrophic changes noted, no discharge, no lesions              Cervix: no lesions              Pap taken: Yes.   Bimanual Exam:  Uterus:  normal size, contour, position, consistency, mobility, non-tender              Adnexa: normal adnexa and no mass, fullness, tenderness               Rectovaginal: Confirms               Anus:  normal sphincter tone, no lesions   Fingers that are the most changed to pt.  Chaperone, 07/01/2008, CMA, was present for exam.  A:  Well Woman with normal exam PMP, no HRT H/o mild renal insufficiency due to spironolactone, renal function has normalized Vaginal atrophy Family hx of colon cancer/adenomatous polyps (mother and sister).   Joint swelling, pain, changes in shape of fingers esp first and second digits on right hand COPD H/o CIN 05/2003 Fever blisters  P:   Mammogram guidelines reviewed pap smear with HR HPV obtained today Lab work is done with Dr. Cornelia Copa Colonoscopy is due.  She is aware this is due.   Shingrix vaccination discussed.  She is planning on having this done this year. Rheumatology referral made for pt. Rx for valtrex 2 gram with symptom onset, repeat 12 hours sent to pharmacy on file. return annually or prn

## 2019-11-26 ENCOUNTER — Other Ambulatory Visit: Payer: Self-pay

## 2019-11-26 ENCOUNTER — Other Ambulatory Visit (HOSPITAL_COMMUNITY)
Admission: RE | Admit: 2019-11-26 | Discharge: 2019-11-26 | Disposition: A | Discharge status: Home / Self Care | Payer: 59 | Source: Ambulatory Visit | Attending: Obstetrics & Gynecology | Admitting: Obstetrics & Gynecology

## 2019-11-26 ENCOUNTER — Encounter: Payer: Self-pay | Admitting: Obstetrics & Gynecology

## 2019-11-26 ENCOUNTER — Ambulatory Visit (INDEPENDENT_AMBULATORY_CARE_PROVIDER_SITE_OTHER): Payer: 59 | Admitting: Obstetrics & Gynecology

## 2019-11-26 VITALS — BP 126/76 | HR 70 | Resp 14 | Ht 64.0 in | Wt 124.4 lb

## 2019-11-26 DIAGNOSIS — M25542 Pain in joints of left hand: Secondary | ICD-10-CM | POA: Diagnosis not present

## 2019-11-26 DIAGNOSIS — M25541 Pain in joints of right hand: Secondary | ICD-10-CM

## 2019-11-26 DIAGNOSIS — Z01419 Encounter for gynecological examination (general) (routine) without abnormal findings: Secondary | ICD-10-CM

## 2019-11-26 DIAGNOSIS — Z8261 Family history of arthritis: Secondary | ICD-10-CM | POA: Diagnosis not present

## 2019-11-26 DIAGNOSIS — Z124 Encounter for screening for malignant neoplasm of cervix: Secondary | ICD-10-CM

## 2019-11-26 DIAGNOSIS — Z840 Family history of diseases of the skin and subcutaneous tissue: Secondary | ICD-10-CM

## 2019-11-26 MED ORDER — VALACYCLOVIR HCL 1 G PO TABS: ORAL_TABLET | ORAL | 1 refills | Status: DC

## 2019-11-26 NOTE — Addendum Note (Signed)
Addended by: Jerene Bears on: 11/26/2019 01:01 PM   Modules accepted: Orders

## 2019-11-27 LAB — CYTOLOGY - PAP
Comment: NEGATIVE
Diagnosis: NEGATIVE
High risk HPV: NEGATIVE

## 2019-12-14 ENCOUNTER — Ambulatory Visit: Payer: Commercial Managed Care - PPO | Admitting: Obstetrics & Gynecology

## 2020-01-20 ENCOUNTER — Other Ambulatory Visit: Payer: Self-pay | Admitting: Obstetrics & Gynecology

## 2020-01-20 DIAGNOSIS — B009 Herpesviral infection, unspecified: Secondary | ICD-10-CM

## 2020-01-21 NOTE — Telephone Encounter (Signed)
Medication refill request: Valacyclovir 1000mg  Last AEX:  11/26/19 Next AEX: Not Scheduled Last MMG (if hormonal medication request): NA Refill authorized: 30/1

## 2020-02-07 DIAGNOSIS — H5212 Myopia, left eye: Secondary | ICD-10-CM | POA: Diagnosis not present

## 2020-03-19 DIAGNOSIS — Z1211 Encounter for screening for malignant neoplasm of colon: Secondary | ICD-10-CM | POA: Diagnosis not present

## 2020-03-19 DIAGNOSIS — Z800 Family history of malignant neoplasm of digestive organs: Secondary | ICD-10-CM | POA: Diagnosis not present

## 2020-03-19 DIAGNOSIS — Z8371 Family history of colonic polyps: Secondary | ICD-10-CM | POA: Diagnosis not present

## 2020-03-20 DIAGNOSIS — Z980 Intestinal bypass and anastomosis status: Secondary | ICD-10-CM | POA: Diagnosis not present

## 2020-03-20 DIAGNOSIS — Z8371 Family history of colonic polyps: Secondary | ICD-10-CM | POA: Diagnosis not present

## 2020-03-20 DIAGNOSIS — Z1211 Encounter for screening for malignant neoplasm of colon: Secondary | ICD-10-CM | POA: Diagnosis not present

## 2020-03-20 DIAGNOSIS — K640 First degree hemorrhoids: Secondary | ICD-10-CM | POA: Diagnosis not present

## 2020-03-20 DIAGNOSIS — Z800 Family history of malignant neoplasm of digestive organs: Secondary | ICD-10-CM | POA: Diagnosis not present

## 2020-05-01 NOTE — Progress Notes (Signed)
Office Visit Note  Patient: Mary Trujillo             Date of Birth: 1955/05/13           MRN: [id number]             PCP: Thomes Dinning, MD Referring: Megan Salon, MD Visit Date: 05/06/2020 Occupation: @GUAROCC @  Subjective:  Pain in both hands   History of Present Illness: Mary Trujillo is a 64 y.o. female seen in consultation at the request of Dr. Sabra Heck.  According the patient she has had pain in both hands for many years.  Over the years the symptoms have been gradually getting worse.  Now she has difficulty gripping objects.  She has decreased grip strength.  None of the other joints are painful.  She has noticed some changes in her feet.  She denies any history of joint swelling or myalgias.  There is family history of psoriasis.  She denies any rash.  She also complains of some lower back pain.  She denies any radiculopathy.  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  Patient states she had recent toenail infection patient responded to the treatment.   Activities of Daily Living:  Patient reports morning stiffness for all day. Patient Denies nocturnal pain.  Difficulty dressing/grooming: Reports Difficulty climbing stairs: Denies Difficulty getting out of chair: Denies Difficulty using hands for taps, buttons, cutlery, and/or writing: Reports  Review of Systems  Constitutional: Negative for fatigue.  HENT: Negative for mouth sores, mouth dryness and nose dryness.   Eyes: Positive for itching. Negative for pain and dryness.  Respiratory: Negative for shortness of breath and difficulty breathing.   Cardiovascular: Negative for chest pain and palpitations.  Gastrointestinal: Negative for blood in stool, constipation and diarrhea.  Endocrine: Negative for increased urination.  Genitourinary: Negative for difficulty urinating.  Musculoskeletal: Positive for arthralgias, joint pain and morning stiffness. Negative for joint swelling,  myalgias, muscle tenderness and myalgias.  Skin: Negative for color change, rash and redness.  Allergic/Immunologic: Negative for susceptible to infections.  Neurological: Positive for memory loss. Negative for dizziness, numbness, headaches and weakness.  Hematological: Positive for bruising/bleeding tendency.  Psychiatric/Behavioral: Negative for confusion and sleep disturbance. The patient is nervous/anxious.     PMFS History:  Patient Active Problem List   Diagnosis Date Noted  . Closed fracture of surgical neck of humerus 01/03/2018  . Herpes simplex type 1 infection 12/01/2013  . Cervical sympathetic dystrophy 12/01/2013  . FO (foramen ovale) 12/01/2013  . Renal insufficiency, mild 12/01/2013  . 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 COPD (chronic obstructive pulmonary disease) ([place]) 09/27/2013  . Disorder of kidney and ureter 07/18/2013  . Polycystic ovaries 07/18/2013    Past Medical History:  Diagnosis Date  . Abnormal pap 2005   CIN 1- Colpo CIN1, Neg ECC  . Ankle fracture   . Anxiety   . Arm fracture, left 2019  . Colitis   . COPD (chronic obstructive pulmonary disease) ([place]) 4/15   had spirometry   . Hirsutism   . History of PCOS   . Hx of migraines   . Osteopenia     Family History  Problem Relation Age of Onset  . Cancer - Ovarian Mother 44  . Cancer - Colon Mother 81  . Arthritis Mother   . Hypertension Father   . Arthritis Father        psoriatric arthritis  . Psoriasis Father   .  Diabetes Paternal Grandfather   . Heart disease Maternal Grandfather   . Rheum arthritis Maternal Grandfather   . Colonic polyp Sister        adenosarcoma in the polyp, did not have colon resection  . Rheum arthritis Sister   . Colon cancer Cousin 52       paternal cousin  . Prostate cancer Brother    Past Surgical History:  Procedure Laterality Date  . APPENDECTOMY    . CATARACT EXTRACTION, BILATERAL Bilateral 04/2017  . PARTIAL COLECTOMY Right 12/2002   with appendectomy    Social History   Social History Narrative  . Not on file   Immunization History  Administered Date(s) Administered  . Influenza, Quadrivalent, Recombinant, Inj, Pf 02/04/2018  . Influenza, Seasonal, Injecte, Preservative Fre 05/30/2012, 01/31/2018  . Influenza,inj,Quad PF,6+ Mos 03/06/2020  . Influenza,inj,quad, With Preservative 05/03/2017  . Influenza-Unspecified 03/23/2007, 05/30/2012, 05/12/2017, 02/11/2018  . MMR 09/17/2017, 09/19/2017  . PFIZER SARS-COV-2 Vaccination 07/25/2019, 08/16/2019, 03/06/2020  . Tdap 12/01/2007, 05/12/2017     Objective: Vital Signs: BP (!) 146/81 (BP Location: Right Arm, Patient Position: Sitting, Cuff Size: Normal)   Pulse 62   Resp 14   Ht $R'5\' 4"'vJ$  (1.626 m)   Wt 125 lb (56.7 kg)   LMP 05/03/2008   BMI 21.46 kg/m    Physical Exam Vitals and nursing note reviewed.  Constitutional:      Appearance: She is well-developed and well-nourished.  HENT:     Head: Normocephalic and atraumatic.  Eyes:     Extraocular Movements: EOM normal.     Conjunctiva/sclera: Conjunctivae normal.  Cardiovascular:     Rate and Rhythm: Normal rate and regular rhythm.     Pulses: Intact distal pulses.     Heart sounds: Normal heart sounds.  Pulmonary:     Effort: Pulmonary effort is normal.     Breath sounds: Normal breath sounds.  Abdominal:     General: Bowel sounds are normal.     Palpations: Abdomen is soft.  Musculoskeletal:     Cervical back: Normal range of motion.  Lymphadenopathy:     Cervical: No cervical adenopathy.  Skin:    General: Skin is warm and dry.     Capillary Refill: Capillary refill takes less than 2 seconds.  Neurological:     Mental Status: She is alert and oriented to person, place, and time.  Psychiatric:        Mood and Affect: Mood and affect normal.        Behavior: Behavior normal.      Musculoskeletal Exam: C-spine, thoracic and lumbar spine were in good range of motion.  She has some discomfort in the lower lumbar  region.  She had no SI joint tenderness.  Shoulder joints, elbow joints, and wrist joints with good range of motion.  She has bilateral CMC prominence and subluxation.  She has PIP and DIP thickening with no inflammation and subluxation of some of the PIP and DIP joints.  No nail pitting was noted.  Hip joints and knee joints in good range of motion.  Ankle joints in good range of motion without synovitis.  She has bilateral PIP and DIP thickening and bilateral first MTP thickening consistent with osteoarthritis.  CDAI Exam: CDAI Score: -- Patient Global: --; Provider Global: -- Swollen: --; Tender: -- Joint Exam 05/06/2020   No joint exam has been documented for this visit   There is currently no information documented on the homunculus. Go to the Rheumatology activity  and complete the homunculus joint exam.  Investigation: No additional findings.  Imaging: No results found.  Recent Labs: Lab Results  Component Value Date   WBC 7.3 01/29/2016   HGB 14.5 01/29/2016   PLT 320 01/29/2016   NA 142 01/29/2016   K 4.4 01/29/2016   CL 103 01/29/2016   CO2 27 01/29/2016   GLUCOSE 80 01/29/2016   BUN 16 01/29/2016   CREATININE 0.80 01/29/2016   BILITOT 0.8 01/29/2016   ALKPHOS 80 01/29/2016   AST 22 01/29/2016   ALT 18 01/29/2016   PROT 7.1 01/29/2016   ALBUMIN 4.5 01/29/2016   CALCIUM 9.8 01/29/2016   GFRAA >89 09/26/2012    Speciality Comments: No specialty comments available.  Procedures:  No procedures performed Allergies: Codeine, Minocin [minocycline hcl], Nsaids, and Ultram [tramadol]   Assessment / Plan:     Visit Diagnoses: Pain in both hands -she complains of pain and discomfort in her bilateral hands.  She has bilateral CMC, PIP and DIP thickening with no synovitis.  Detailed counseling guarding osteoarthritis was provided.  Joint protection muscle strengthening was discussed.  A handout on hand exercises was given.  I will refer her to physical therapy.  Plan: XR  Hand 2 View Right, XR Hand 2 View Left.  X-ray showed bilateral second and third MCP joint, CMC PIP and DIP narrowing.  These findings are consistent with osteoarthritis.  Narrowing of MCP joints raises the concern about possible hemochromatosis.  I will obtain following labs today.  Natural anti-inflammatories were discussed.  Primary osteoarthritis of both feet-she has bilateral first MTP, PIP and DIP thickening.  No synovitis was noted.  She may benefit from arch support.  She has some callus formation over right fourth toe.  Her bilateral third and fourth toes was sutured in between the interdigital space during her childhood due to fourth toe instability per patient.  She does not has any discomfort now.  Chronic midline low back pain without sciatica -she complains of lower back pain off and on.  She states the pain does not radiate into her lower extremities.  She denies any SI joint discomfort.  Plan: XR Lumbar Spine 2-3 Views.  X-ray showed L4-L5, L5-S1 narrowing and facet joint arthropathy.  Core strengthening exercises were discussed.  I have given her a handout on back exercises.  Family history of psoriatic arthritis-she states her father had severe psoriasis and psoriatic arthritis.  She has no rash on examination.  No nail pitting was noted.  Renal insufficiency, mild-I do not have any recent labs to assess GFR.  Herpes simplex type 1 infection  Cervical sympathetic dystrophy  Horner's syndrome  Polycystic ovaries  History of COPD  History of anxiety-she is on Prozac.  History of eczema  Orders: Orders Placed This Encounter  Procedures  . XR Hand 2 View Right  . XR Hand 2 View Left  . XR Lumbar Spine 2-3 Views  . Sedimentation rate  . Rheumatoid factor  . Cyclic citrul peptide antibody, IgG  . Uric acid  . Iron, TIBC and Ferritin Panel  . Ambulatory referral to Physical Therapy   No orders of the defined types were placed in this encounter.     Follow-Up  Instructions: Return for Osteoarthritis.   Bo Merino, MD  Note - This record has been created using Editor, commissioning.  Chart creation errors have been sought, but may not always  have been located. Such creation errors do not reflect on  the standard of medical  care.,

## 2020-05-06 ENCOUNTER — Encounter: Payer: Self-pay | Admitting: Rheumatology

## 2020-05-06 ENCOUNTER — Ambulatory Visit: Payer: Self-pay

## 2020-05-06 ENCOUNTER — Other Ambulatory Visit: Payer: Self-pay

## 2020-05-06 ENCOUNTER — Ambulatory Visit (INDEPENDENT_AMBULATORY_CARE_PROVIDER_SITE_OTHER): Payer: PRIVATE HEALTH INSURANCE | Admitting: Rheumatology

## 2020-05-06 VITALS — BP 146/81 | HR 62 | Resp 14 | Ht 64.0 in | Wt 125.0 lb

## 2020-05-06 DIAGNOSIS — Z8659 Personal history of other mental and behavioral disorders: Secondary | ICD-10-CM

## 2020-05-06 DIAGNOSIS — B009 Herpesviral infection, unspecified: Secondary | ICD-10-CM

## 2020-05-06 DIAGNOSIS — G8929 Other chronic pain: Secondary | ICD-10-CM

## 2020-05-06 DIAGNOSIS — M19072 Primary osteoarthritis, left ankle and foot: Secondary | ICD-10-CM

## 2020-05-06 DIAGNOSIS — M545 Low back pain, unspecified: Secondary | ICD-10-CM | POA: Diagnosis not present

## 2020-05-06 DIAGNOSIS — Z840 Family history of diseases of the skin and subcutaneous tissue: Secondary | ICD-10-CM

## 2020-05-06 DIAGNOSIS — N289 Disorder of kidney and ureter, unspecified: Secondary | ICD-10-CM

## 2020-05-06 DIAGNOSIS — M79641 Pain in right hand: Secondary | ICD-10-CM | POA: Diagnosis not present

## 2020-05-06 DIAGNOSIS — M19071 Primary osteoarthritis, right ankle and foot: Secondary | ICD-10-CM

## 2020-05-06 DIAGNOSIS — M79642 Pain in left hand: Secondary | ICD-10-CM | POA: Diagnosis not present

## 2020-05-06 DIAGNOSIS — G902 Horner's syndrome: Secondary | ICD-10-CM

## 2020-05-06 DIAGNOSIS — Z8709 Personal history of other diseases of the respiratory system: Secondary | ICD-10-CM

## 2020-05-06 DIAGNOSIS — Z872 Personal history of diseases of the skin and subcutaneous tissue: Secondary | ICD-10-CM

## 2020-05-06 DIAGNOSIS — Z8261 Family history of arthritis: Secondary | ICD-10-CM | POA: Diagnosis not present

## 2020-05-06 DIAGNOSIS — E282 Polycystic ovarian syndrome: Secondary | ICD-10-CM

## 2020-05-06 NOTE — Patient Instructions (Addendum)
Hand Exercises Hand exercises can be helpful for almost anyone. These exercises can strengthen the hands, improve flexibility and movement, and increase blood flow to the hands. These results can make work and daily tasks easier. Hand exercises can be especially helpful for people who have joint pain from arthritis or have nerve damage from overuse (carpal tunnel syndrome). These exercises can also help people who have injured a hand. Exercises Most of these hand exercises are gentle stretching and motion exercises. It is usually safe to do them often throughout the day. Warming up your hands before exercise may help to reduce stiffness. You can do this with gentle massage or by placing your hands in warm water for 10-15 minutes. It is normal to feel some stretching, pulling, tightness, or mild discomfort as you begin new exercises. This will gradually improve. Stop an exercise right away if you feel sudden, severe pain or your pain gets worse. Ask your health care provider which exercises are best for you. Knuckle bend or "claw" fist 1. Stand or sit with your arm, hand, and all five fingers pointed straight up. Make sure to keep your wrist straight during the exercise. 2. Gently bend your fingers down toward your palm until the tips of your fingers are touching the top of your palm. Keep your big knuckle straight and just bend the small knuckles in your fingers. 3. Hold this position for __________ seconds. 4. Straighten (extend) your fingers back to the starting position. Repeat this exercise 5-10 times with each hand. Full finger fist 1. Stand or sit with your arm, hand, and all five fingers pointed straight up. Make sure to keep your wrist straight during the exercise. 2. Gently bend your fingers into your palm until the tips of your fingers are touching the middle of your palm. 3. Hold this position for __________ seconds. 4. Extend your fingers back to the starting position, stretching every  joint fully. Repeat this exercise 5-10 times with each hand. Straight fist 1. Stand or sit with your arm, hand, and all five fingers pointed straight up. Make sure to keep your wrist straight during the exercise. 2. Gently bend your fingers at the big knuckle, where your fingers meet your hand, and the middle knuckle. Keep the knuckle at the tips of your fingers straight and try to touch the bottom of your palm. 3. Hold this position for __________ seconds. 4. Extend your fingers back to the starting position, stretching every joint fully. Repeat this exercise 5-10 times with each hand. Tabletop 1. Stand or sit with your arm, hand, and all five fingers pointed straight up. Make sure to keep your wrist straight during the exercise. 2. Gently bend your fingers at the big knuckle, where your fingers meet your hand, as far down as you can while keeping the small knuckles in your fingers straight. Think of forming a tabletop with your fingers. 3. Hold this position for __________ seconds. 4. Extend your fingers back to the starting position, stretching every joint fully. Repeat this exercise 5-10 times with each hand. Finger spread 1. Place your hand flat on a table with your palm facing down. Make sure your wrist stays straight as you do this exercise. 2. Spread your fingers and thumb apart from each other as far as you can until you feel a gentle stretch. Hold this position for __________ seconds. 3. Bring your fingers and thumb tight together again. Hold this position for __________ seconds. Repeat this exercise 5-10 times with each hand.   Making circles 1. Stand or sit with your arm, hand, and all five fingers pointed straight up. Make sure to keep your wrist straight during the exercise. 2. Make a circle by touching the tip of your thumb to the tip of your index finger. 3. Hold for __________ seconds. Then open your hand wide. 4. Repeat this motion with your thumb and each finger on your hand.  Repeat this exercise 5-10 times with each hand. Thumb motion 1. Sit with your forearm resting on a table and your wrist straight. Your thumb should be facing up toward the ceiling. Keep your fingers relaxed as you move your thumb. 2. Lift your thumb up as high as you can toward the ceiling. Hold for __________ seconds. 3. Bend your thumb across your palm as far as you can, reaching the tip of your thumb for the small finger (pinkie) side of your palm. Hold for __________ seconds. Repeat this exercise 5-10 times with each hand. Grip strengthening  1. Hold a stress ball or other soft ball in the middle of your hand. 2. Slowly increase the pressure, squeezing the ball as much as you can without causing pain. Think of bringing the tips of your fingers into the middle of your palm. All of your finger joints should bend when doing this exercise. 3. Hold your squeeze for __________ seconds, then relax. Repeat this exercise 5-10 times with each hand. Contact a health care provider if:  Your hand pain or discomfort gets much worse when you do an exercise.  Your hand pain or discomfort does not improve within 2 hours after you exercise. If you have any of these problems, stop doing these exercises right away. Do not do them again unless your health care provider says that you can. Get help right away if:  You develop sudden, severe hand pain or swelling. If this happens, stop doing these exercises right away. Do not do them again unless your health care provider says that you can. This information is not intended to replace advice given to you by your health care provider. Make sure you discuss any questions you have with your health care provider. Document Revised: 08/10/2018 Document Reviewed: 04/20/2018 [employer] Patient Education  2020 [employer] Back Exercises The following exercises strengthen the muscles that help to support the trunk and back. They also help to keep the lower back  flexible. Doing these exercises can help to prevent back pain or lessen existing pain.  If you have back pain or discomfort, try doing these exercises 2-3 times each day or as told by your health care provider.  As your pain improves, do them once each day, but increase the number of times that you repeat the steps for each exercise (do more repetitions).  To prevent the recurrence of back pain, continue to do these exercises once each day or as told by your health care provider. Do exercises exactly as told by your health care provider and adjust them as directed. It is normal to feel mild stretching, pulling, tightness, or discomfort as you do these exercises, but you should stop right away if you feel sudden pain or your pain gets worse. Exercises Single knee to chest Repeat these steps 3-5 times for each leg: 1. Lie on your back on a firm bed or the floor with your legs extended. 2. Bring one knee to your chest. Your other leg should stay extended and in contact with the floor. 3. Hold your knee in place by grabbing   your knee or thigh with both hands and hold. 4. Pull on your knee until you feel a gentle stretch in your lower back or buttocks. 5. Hold the stretch for 10-30 seconds. 6. Slowly release and straighten your leg. Pelvic tilt Repeat these steps 5-10 times: 1. Lie on your back on a firm bed or the floor with your legs extended. 2. Bend your knees so they are pointing toward the ceiling and your feet are flat on the floor. 3. Tighten your lower abdominal muscles to press your lower back against the floor. This motion will tilt your pelvis so your tailbone points up toward the ceiling instead of pointing to your feet or the floor. 4. With gentle tension and even breathing, hold this position for 5-10 seconds. Cat-cow Repeat these steps until your lower back becomes more flexible: 1. Get into a hands-and-knees position on a firm surface. Keep your hands under your shoulders, and  keep your knees under your hips. You may place padding under your knees for comfort. 2. Let your head hang down toward your chest. Contract your abdominal muscles and point your tailbone toward the floor so your lower back becomes rounded like the back of a cat. 3. Hold this position for 5 seconds. 4. Slowly lift your head, let your abdominal muscles relax and point your tailbone up toward the ceiling so your back forms a sagging arch like the back of a cow. 5. Hold this position for 5 seconds.  Press-ups Repeat these steps 5-10 times: 1. Lie on your abdomen (face-down) on the floor. 2. Place your palms near your head, about shoulder-width apart. 3. Keeping your back as relaxed as possible and keeping your hips on the floor, slowly straighten your arms to raise the top half of your body and lift your shoulders. Do not use your back muscles to raise your upper torso. You may adjust the placement of your hands to make yourself more comfortable. 4. Hold this position for 5 seconds while you keep your back relaxed. 5. Slowly return to lying flat on the floor.  Bridges Repeat these steps 10 times: 1. Lie on your back on a firm surface. 2. Bend your knees so they are pointing toward the ceiling and your feet are flat on the floor. Your arms should be flat at your sides, next to your body. 3. Tighten your buttocks muscles and lift your buttocks off the floor until your waist is at almost the same height as your knees. You should feel the muscles working in your buttocks and the back of your thighs. If you do not feel these muscles, slide your feet 1-2 inches farther away from your buttocks. 4. Hold this position for 3-5 seconds. 5. Slowly lower your hips to the starting position, and allow your buttocks muscles to relax completely. If this exercise is too easy, try doing it with your arms crossed over your chest. Abdominal crunches Repeat these steps 5-10 times: 1. Lie on your back on a firm bed or  the floor with your legs extended. 2. Bend your knees so they are pointing toward the ceiling and your feet are flat on the floor. 3. Cross your arms over your chest. 4. Tip your chin slightly toward your chest without bending your neck. 5. Tighten your abdominal muscles and slowly raise your trunk (torso) high enough to lift your shoulder blades a tiny bit off the floor. Avoid raising your torso higher than that because it can put too much stress on   your low back and does not help to strengthen your abdominal muscles. 6. Slowly return to your starting position. Back lifts Repeat these steps 5-10 times: 1. Lie on your abdomen (face-down) with your arms at your sides, and rest your forehead on the floor. 2. Tighten the muscles in your legs and your buttocks. 3. Slowly lift your chest off the floor while you keep your hips pressed to the floor. Keep the back of your head in line with the curve in your back. Your eyes should be looking at the floor. 4. Hold this position for 3-5 seconds. 5. Slowly return to your starting position. Contact a health care provider if:  Your back pain or discomfort gets much worse when you do an exercise.  Your worsening back pain or discomfort does not lessen within 2 hours after you exercise. If you have any of these problems, stop doing these exercises right away. Do not do them again unless your health care provider says that you can. Get help right away if:  You develop sudden, severe back pain. If this happens, stop doing the exercises right away. Do not do them again unless your health care provider says that you can. This information is not intended to replace advice given to you by your health care provider. Make sure you discuss any questions you have with your health care provider. Document Revised: 08/24/2018 Document Reviewed: 01/19/2018 Elsevier Patient Education  2020 [employer]  

## 2020-05-06 NOTE — Addendum Note (Signed)
Addended by: Ellen Henri on: 05/06/2020 03:58 PM   Modules accepted: Orders

## 2020-05-07 LAB — SEDIMENTATION RATE: Sed Rate: 14 mm/h (ref 0–30)

## 2020-05-07 LAB — IRON,TIBC AND FERRITIN PANEL
%SAT: 18 % (calc) (ref 16–45)
Ferritin: 45 ng/mL (ref 16–288)
Iron: 65 ug/dL (ref 45–160)
TIBC: 361 mcg/dL (calc) (ref 250–450)

## 2020-05-07 LAB — RHEUMATOID FACTOR: Rheumatoid fact SerPl-aCnc: <14 IU/mL (ref <14)

## 2020-05-07 LAB — URIC ACID: Uric Acid, Serum: 4.6 mg/dL (ref 2.5–7.0)

## 2020-05-07 LAB — CYCLIC CITRUL PEPTIDE ANTIBODY, IGG: Cyclic Citrullin Peptide Ab: <16 UNITS

## 2020-05-07 NOTE — Progress Notes (Signed)
I will discuss results at the follow-up visit.

## 2020-05-14 NOTE — Progress Notes (Signed)
 Office Visit Note  Patient: Mary Trujillo             Date of Birth: 02/21/1956           MRN: [id number]             PCP: Maldonado, Edgardo, MD Referring: Maldonado, Edgardo, MD Visit Date: 05/28/2020 Occupation: @GUAROCC@  Subjective:  Pain in both hands and feet.   History of Present Illness: Mary Trujillo is a 65 y.o. female with history of osteoarthritis.  She states she continues to have some pain and discomfort in her bilateral hands and her bilateral feet.  He also has some lower back pain.  She states she has an appointment coming up with physical therapy soon.  She did not get a chance to do any of the exercises yet.  She has been taking natural anti-inflammatories.  Activities of Daily Living:  Patient reports morning stiffness for 1 hour.   Patient Denies nocturnal pain.  Difficulty dressing/grooming: Reports Difficulty climbing stairs: Denies Difficulty getting out of chair: Denies Difficulty using hands for taps, buttons, cutlery, and/or writing: Reports  Review of Systems  Constitutional: Negative for fatigue.  HENT: Positive for mouth dryness and nose dryness. Negative for mouth sores.   Eyes: Negative for pain, itching and dryness.  Respiratory: Negative for shortness of breath and difficulty breathing.   Cardiovascular: Negative for chest pain and palpitations.  Gastrointestinal: Negative for blood in stool, constipation and diarrhea.  Endocrine: Negative for increased urination.  Genitourinary: Negative for difficulty urinating.  Musculoskeletal: Positive for arthralgias, joint pain and morning stiffness. Negative for joint swelling, myalgias, muscle tenderness and myalgias.  Skin: Negative for color change, rash and redness.  Allergic/Immunologic: Negative for susceptible to infections.  Neurological: Positive for memory loss. Negative for dizziness, numbness, headaches and weakness.  Hematological: Positive for bruising/bleeding tendency.   Psychiatric/Behavioral: Negative for confusion.    PMFS History:  Patient Active Problem List   Diagnosis Date Noted  . Closed fracture of surgical neck of humerus 01/03/2018  . Herpes simplex type 1 infection 12/01/2013  . Cervical sympathetic dystrophy 12/01/2013  . FO (foramen ovale) 12/01/2013  . Renal insufficiency, mild 12/01/2013  . 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 COPD (chronic obstructive pulmonary disease) (HCC) 09/27/2013  . Disorder of kidney and ureter 07/18/2013  . Polycystic ovaries 07/18/2013    Past Medical History:  Diagnosis Date  . Abnormal pap 2005   CIN 1- Colpo CIN1, Neg ECC  . Ankle fracture   . Anxiety   . Arm fracture, left 2019  . Colitis   . COPD (chronic obstructive pulmonary disease) (HCC) 4/15   had spirometry   . Hirsutism   . History of PCOS   . Hx of migraines   . Osteopenia     Family History  Problem Relation Age of Onset  . Cancer - Ovarian Mother 41  . Cancer - Colon Mother 83  . Arthritis Mother   . Hypertension Father   . Arthritis Father        psoriatric arthritis  . Psoriasis Father   . Diabetes Paternal Grandfather   . Heart disease Maternal Grandfather   . Rheum arthritis Maternal Grandfather   . Colonic polyp Sister        adenosarcoma in the polyp, did not have colon resection  . Rheum arthritis Sister   . Colon cancer Cousin 69       paternal cousin  . Prostate cancer Brother      Past Surgical History:  Procedure Laterality Date  . APPENDECTOMY    . CATARACT EXTRACTION, BILATERAL Bilateral 04/2017  . PARTIAL COLECTOMY Right 12/2002   with appendectomy   Social History   Social History Narrative  . Not on file   Immunization History  Administered Date(s) Administered  . Influenza, Quadrivalent, Recombinant, Inj, Pf 02/04/2018  . Influenza, Seasonal, Injecte, Preservative Fre 05/30/2012, 01/31/2018  . Influenza,inj,Quad PF,6+ Mos 03/06/2020  . Influenza,inj,quad, With Preservative 05/03/2017  .  Influenza-Unspecified 03/23/2007, 05/30/2012, 05/12/2017, 02/11/2018  . MMR 09/17/2017, 09/19/2017  . PFIZER(Purple Top)SARS-COV-2 Vaccination 07/25/2019, 08/16/2019, 03/06/2020  . Tdap 12/01/2007, 05/12/2017     Objective: Vital Signs: BP 131/82 (BP Location: Right Arm, Patient Position: Sitting, Cuff Size: Normal)   Pulse 60   Resp 14   Ht 5' 4" (1.626 m)   Wt 128 lb 9.6 oz (58.3 kg)   LMP 05/03/2008   BMI 22.07 kg/m    Physical Exam Vitals and nursing note reviewed.  Constitutional:      Appearance: She is well-developed and well-nourished.  HENT:     Head: Normocephalic and atraumatic.  Eyes:     Extraocular Movements: EOM normal.     Conjunctiva/sclera: Conjunctivae normal.  Cardiovascular:     Rate and Rhythm: Normal rate and regular rhythm.     Pulses: Intact distal pulses.     Heart sounds: Normal heart sounds.  Pulmonary:     Effort: Pulmonary effort is normal.     Breath sounds: Normal breath sounds.  Abdominal:     General: Bowel sounds are normal.     Palpations: Abdomen is soft.  Musculoskeletal:     Cervical back: Normal range of motion.  Lymphadenopathy:     Cervical: No cervical adenopathy.  Skin:    General: Skin is warm and dry.     Capillary Refill: Capillary refill takes less than 2 seconds.  Neurological:     Mental Status: She is alert and oriented to person, place, and time.  Psychiatric:        Mood and Affect: Mood and affect normal.        Behavior: Behavior normal.      Musculoskeletal Exam: C-spine and lumbar spine were in good range of motion.  Shoulder joints, elbow joints, wrist joints with good range of motion.  She had bilateral CMC prominence and subluxation.  She has subluxation of her right second DIP joint.  PIP and DIP thickening was noted.  No synovitis was noted.  Hip joints and knee joints with good range of motion.  She had no tenderness over ankles or MTPs. CDAI Exam: CDAI Score: -- Patient Global: --; Provider Global:  -- Swollen: --; Tender: -- Joint Exam 05/28/2020   No joint exam has been documented for this visit   There is currently no information documented on the homunculus. Go to the Rheumatology activity and complete the homunculus joint exam.  Investigation: No additional findings.  Imaging: XR Hand 2 View Left  Result Date: 05/06/2020 Severe CMC narrowing and subluxation was noted.  Narrowing of the second and third MCP joints was noted.  PIP and DIP severe narrowing with osteophyte formation was noted.  No intercarpal or radiocarpal joint space narrowing was noted.  No erosive changes were noted. Impression: These findings are consistent with severe osteoarthritis and possible inflammatory arthritis most likely CPPD.  XR Hand 2 View Right  Result Date: 05/06/2020 Second and third MCP narrowing with osteophyte formation was noted.  Severe PIP narrowing with  subluxation of third MCP joint was noted.  Narrowing of all DIP joints was noted.  Severe CMC narrowing was noted.  No intercarpal or radiocarpal joint space narrowing was noted.  No erosive changes are noted. Impression: These findings are consistent with severe osteoarthritis.  Narrowing of second and third MCP joint raises suspicion of inflammatory arthritis most likely CPPD.  XR Lumbar Spine 2-3 Views  Result Date: 05/06/2020 Mild dextroscoliosis was noted.  L4-5 and L5-S1 narrowing was noted.  Facet joint arthropathy was noted. Impression: These findings are consistent with degenerative disc disease and facet joint arthropathy of the lumbar spine.   Recent Labs: Lab Results  Component Value Date   WBC 7.3 01/29/2016   HGB 14.5 01/29/2016   PLT 320 01/29/2016   NA 142 01/29/2016   K 4.4 01/29/2016   CL 103 01/29/2016   CO2 27 01/29/2016   GLUCOSE 80 01/29/2016   BUN 16 01/29/2016   CREATININE 0.80 01/29/2016   BILITOT 0.8 01/29/2016   ALKPHOS 80 01/29/2016   AST 22 01/29/2016   ALT 18 01/29/2016   PROT 7.1 01/29/2016    ALBUMIN 4.5 01/29/2016   CALCIUM 9.8 01/29/2016   GFRAA >89 09/26/2012   May 07, 2019 iron studies normal, ESR 14, RF negative, anti-CCP negative, uric acid 4.6  Speciality Comments: No specialty comments available.  Procedures:  No procedures performed Allergies: Codeine, Minocin [minocycline hcl], Nsaids, and Ultram [tramadol]   Assessment / Plan:     Visit Diagnoses: Primary osteoarthritis of both hands - Clinical and radiographic findings were consistent with osteoarthritis.  All autoimmune work-up was negative.  Left findings were discussed.  Joint protection muscle strengthening was discussed.  She will be going for physical therapy.  I have given her prescription for right CMC brace.  If the brace benefits are then she can get a left CMC brace.  Primary osteoarthritis of both feet - Clinical findings were consistent with osteoarthritis.  She had right fourth toe callus formation.  Arch support were advised.  DDD (degenerative disc disease), lumbar - L4-L5, and L5-S1 narrowing.  Mild dextroscoliosis and facet joint arthropathy.  A handout  on exercises was given at the last visit.  She has not started exercises yet.  Family history of psoriatic arthritis - Father  Renal insufficiency, mild  Herpes simplex type 1 infection  Cervical sympathetic dystrophy  Horner's syndrome  History of COPD  History of anxiety  History of eczema  Polycystic ovaries  Orders: No orders of the defined types were placed in this encounter.  No orders of the defined types were placed in this encounter.    Follow-Up Instructions: Return if symptoms worsen or fail to improve, for Osteoarthritis.   Bo Merino, MD  Note - This record has been created using Editor, commissioning.  Chart creation errors have been sought, but may not always  have been located. Such creation errors do not reflect on  the standard of medical care.

## 2020-05-23 ENCOUNTER — Other Ambulatory Visit: Payer: Self-pay | Admitting: Radiology

## 2020-05-23 ENCOUNTER — Telehealth: Payer: Self-pay

## 2020-05-23 DIAGNOSIS — M79642 Pain in left hand: Secondary | ICD-10-CM

## 2020-05-23 NOTE — Telephone Encounter (Signed)
[facility] called stating they received notes on the patient, but didn't receive a formal order.  Please fax order to #[phone]

## 2020-05-23 NOTE — Telephone Encounter (Signed)
Referral has been faxed to [phone].

## 2020-05-28 ENCOUNTER — Encounter: Payer: Self-pay | Admitting: Rheumatology

## 2020-05-28 ENCOUNTER — Other Ambulatory Visit: Payer: Self-pay

## 2020-05-28 ENCOUNTER — Ambulatory Visit (INDEPENDENT_AMBULATORY_CARE_PROVIDER_SITE_OTHER): Payer: PRIVATE HEALTH INSURANCE | Admitting: Rheumatology

## 2020-05-28 VITALS — BP 131/82 | HR 60 | Resp 14 | Ht 64.0 in | Wt 128.6 lb

## 2020-05-28 DIAGNOSIS — N289 Disorder of kidney and ureter, unspecified: Secondary | ICD-10-CM

## 2020-05-28 DIAGNOSIS — Z8261 Family history of arthritis: Secondary | ICD-10-CM

## 2020-05-28 DIAGNOSIS — G902 Horner's syndrome: Secondary | ICD-10-CM

## 2020-05-28 DIAGNOSIS — M19071 Primary osteoarthritis, right ankle and foot: Secondary | ICD-10-CM | POA: Diagnosis not present

## 2020-05-28 DIAGNOSIS — Z840 Family history of diseases of the skin and subcutaneous tissue: Secondary | ICD-10-CM

## 2020-05-28 DIAGNOSIS — Z872 Personal history of diseases of the skin and subcutaneous tissue: Secondary | ICD-10-CM

## 2020-05-28 DIAGNOSIS — E282 Polycystic ovarian syndrome: Secondary | ICD-10-CM

## 2020-05-28 DIAGNOSIS — M19072 Primary osteoarthritis, left ankle and foot: Secondary | ICD-10-CM

## 2020-05-28 DIAGNOSIS — M19041 Primary osteoarthritis, right hand: Secondary | ICD-10-CM

## 2020-05-28 DIAGNOSIS — Z8659 Personal history of other mental and behavioral disorders: Secondary | ICD-10-CM

## 2020-05-28 DIAGNOSIS — Z8709 Personal history of other diseases of the respiratory system: Secondary | ICD-10-CM

## 2020-05-28 DIAGNOSIS — M5136 Other intervertebral disc degeneration, lumbar region: Secondary | ICD-10-CM | POA: Diagnosis not present

## 2020-05-28 DIAGNOSIS — M19042 Primary osteoarthritis, left hand: Secondary | ICD-10-CM

## 2020-05-28 DIAGNOSIS — B009 Herpesviral infection, unspecified: Secondary | ICD-10-CM

## 2020-09-22 ENCOUNTER — Other Ambulatory Visit: Payer: Self-pay | Admitting: Obstetrics & Gynecology

## 2021-01-30 DIAGNOSIS — Z8616 Personal history of COVID-19: Secondary | ICD-10-CM

## 2021-01-30 HISTORY — DX: Personal history of COVID-19: Z86.16

## 2021-05-05 NOTE — Progress Notes (Signed)
66 y.o. G0P0000 Married White or Caucasian Not Hispanic or Latino female here for annual exam.   Occasionally sexually active, painful even with vaginal lubrication.    She has osteoarthritis of both of her hands and feet, negative autoimmune w/u with Rheumatology. She has seen OT.  Mother with h/o ovarian cancer at 73 and colon cancer at 86. Brother with prostate cancer that spread to her colon. Her sister had precancerous polyps. She declines seeing a Dietitian.   She has one Psychiatrist and 2 grandsons, in [place].   H/O oral HSV.  H/O PCOS, has h/o hirsutism, seems to be getting worse as she is getting older.   Patient's last menstrual period was 05/03/2008.          Sexually active: No.  The current method of family planning is post menopausal status.    Exercising: No.  The patient does not participate in regular exercise at present. Smoker:  no  Health Maintenance: Pap:  11-26-19 normal, negative hpv History of abnormal Pap:  H/O CIN I in 2005 MMG:  08-07-19 normal BMD:   07-26-17 Osteopenia, T score -2.1, FRAX 17.4/1.5% Colonoscopy: 2020 normal; f/u in 5 years d/t mom had colon cancer TDaP:  2019 Gardasil: n/a   reports that she has never smoked. She has never used smokeless tobacco. She reports current alcohol use of about 4.0 standard drinks per week. She reports that she does not use drugs. Married x 23 years. She is a Doctor, general practice.   Past Medical History:  Diagnosis Date   Abnormal pap 2005   CIN 1- Colpo CIN1, Neg ECC   Ankle fracture    Anxiety    Arm fracture, left 2019   Colitis    COPD (chronic obstructive pulmonary disease) ([place]) 08/2013   had spirometry    Hirsutism    History of COVID-19 01/30/2021   History of PCOS    Hx of migraines    Osteopenia     Past Surgical History:  Procedure Laterality Date   APPENDECTOMY     CATARACT EXTRACTION, BILATERAL Bilateral 04/2017   PARTIAL COLECTOMY Right 12/2002   with appendectomy    Current  Outpatient Medications  Medication Sig Dispense Refill   B Complex Vitamins (VITAMIN-B COMPLEX) TABS Take by mouth.     Calcium Carb-Cholecalciferol (CALCIUM 1000 + D PO) Take by mouth.     FIBER PO Take by mouth 2 (two) times daily.     FLUoxetine (PROZAC) 10 MG tablet TAKE 1 TABLET BY MOUTH EVERY DAY     hydrocortisone 2.5 % cream Apply topically.     L-Lysine 500 MG TABS Take by mouth.     TURMERIC PO Take by mouth daily.     valACYclovir (VALTREX) 1000 MG tablet TAKE 2 GRAMS AND REPEAT IN 12 HOURS FOR FEVER BLISTERS. (Patient taking differently: as needed. Take 2 grams and repeat in 12 hours for fever blisters.) 30 tablet 1   No current facility-administered medications for this visit.    Family History  Problem Relation Age of Onset   Cancer - Ovarian Mother 77   Cancer - Colon Mother 87   Arthritis Mother    Hypertension Father    Arthritis Father        psoriatric arthritis   Psoriasis Father    Diabetes Paternal Grandfather    Heart disease Maternal Grandfather    Rheum arthritis Maternal Grandfather    Colonic polyp Sister        adenosarcoma in  the polyp, did not have colon resection   Rheum arthritis Sister    Colon cancer Cousin 67       paternal cousin   Prostate cancer Brother     Review of Systems  Genitourinary:  Positive for pelvic pain (LLQ x2-3 days which has subsided now).  All other systems reviewed and are negative.  Exam:   BP 130/72    Pulse 75    Ht 5' 3.5" (1.613 m)    Wt 122 lb (55.3 kg)    LMP 05/03/2008    SpO2 99%    BMI 21.27 kg/m   Weight change: @WEIGHTCHANGE @ Height:   Height: 5' 3.5" (161.3 cm)  Ht Readings from Last 3 Encounters:  05/08/21 5' 3.5" (1.613 m)  05/28/20 5\' 4"  (1.626 m)  05/06/20 5\' 4"  (1.626 m)    General appearance: alert, cooperative and appears stated age Head: Normocephalic, without obvious abnormality, atraumatic Neck: no adenopathy, supple, symmetrical, trachea midline and thyroid normal to inspection and  palpation Lungs: clear to auscultation bilaterally Cardiovascular: regular rate and rhythm Breasts: normal appearance, no masses or tenderness Abdomen: soft, non-tender; non distended,  no masses,  no organomegaly Extremities: extremities normal, atraumatic, no cyanosis or edema Skin: Skin color, texture, turgor normal. No rashes or lesions Lymph nodes: Cervical, supraclavicular, and axillary nodes normal. No abnormal inguinal nodes palpated Neurologic: Grossly normal   Pelvic: External genitalia:  no lesions              Urethra:  normal appearing urethra with no masses, tenderness or lesions              Bartholins and Skenes: normal                 Vagina: atrophic appearing vagina with normal color and discharge, no lesions              Cervix: no lesions               Bimanual Exam:  Uterus:  normal size, contour, position, consistency, mobility, non-tender              Adnexa: no mass, fullness, tenderness               Rectovaginal: Confirms               Anus:  normal sphincter tone, no lesions  Marisa Sprinkles, CMA chaperoned for the exam.  1. Well woman exam Discussed breast self exam Discussed calcium and vit D intake Mammogram overdue, she will schedule Colonoscopy UTD  2. Herpes simplex type 1 infection Will call when she needs Valtrex  3. Osteopenia, unspecified location Getting calcium and vit D - DG Bone Density; Future  4. Hirsutism Worsening - Testosterone, Total, LC/MS/MS - 17-Hydroxyprogesterone  5. Laboratory exam ordered as part of routine general medical examination - CBC - Comprehensive metabolic panel - Lipid panel  6. Elevated LDL cholesterol level - Lipid panel  7. Atrophic vaginitis Discussed lubrication - Estradiol 10 MCG TABS vaginal tablet; Place one tablet vaginally qhs x 1 week, then change to 2 x a week  Dispense: 24 tablet; Refill: 4 -Call if she is still having dyspareunia after 1 month -Discussed the option of vaginal dilators.

## 2021-05-08 ENCOUNTER — Ambulatory Visit (INDEPENDENT_AMBULATORY_CARE_PROVIDER_SITE_OTHER): Payer: 59 | Admitting: Obstetrics and Gynecology

## 2021-05-08 ENCOUNTER — Other Ambulatory Visit: Payer: Self-pay

## 2021-05-08 ENCOUNTER — Encounter: Payer: Self-pay | Admitting: Obstetrics and Gynecology

## 2021-05-08 VITALS — BP 130/72 | HR 75 | Ht 63.5 in | Wt 122.0 lb

## 2021-05-08 DIAGNOSIS — L680 Hirsutism: Secondary | ICD-10-CM

## 2021-05-08 DIAGNOSIS — N952 Postmenopausal atrophic vaginitis: Secondary | ICD-10-CM

## 2021-05-08 DIAGNOSIS — E7800 Pure hypercholesterolemia, unspecified: Secondary | ICD-10-CM

## 2021-05-08 DIAGNOSIS — M8580 Other specified disorders of bone density and structure, unspecified site: Secondary | ICD-10-CM | POA: Diagnosis not present

## 2021-05-08 DIAGNOSIS — Z01419 Encounter for gynecological examination (general) (routine) without abnormal findings: Secondary | ICD-10-CM | POA: Diagnosis not present

## 2021-05-08 DIAGNOSIS — Z0000 Encounter for general adult medical examination without abnormal findings: Secondary | ICD-10-CM

## 2021-05-08 DIAGNOSIS — B009 Herpesviral infection, unspecified: Secondary | ICD-10-CM | POA: Diagnosis not present

## 2021-05-08 MED ORDER — ESTRADIOL 10 MCG VA TABS: ORAL_TABLET | VAGINAL | 4 refills | Status: DC

## 2021-05-08 NOTE — Patient Instructions (Addendum)

## 2021-05-13 LAB — TESTOSTERONE, TOTAL, LC/MS/MS: Testosterone, Total, LC-MS-MS: 15 ng/dL (ref 2–45)

## 2021-05-13 LAB — LIPID PANEL
Cholesterol: 238 mg/dL — ABNORMAL HIGH (ref <200)
HDL: 63 mg/dL (ref >=50)
LDL Cholesterol (Calc): 154 mg/dL (calc) — ABNORMAL HIGH
Non-HDL Cholesterol (Calc): 175 mg/dL (calc) — ABNORMAL HIGH (ref <130)
Total CHOL/HDL Ratio: 3.8 (calc) (ref <5.0)
Triglycerides: 99 mg/dL (ref <150)

## 2021-05-13 LAB — CBC
HCT: 43.6 % (ref 35.0–45.0)
Hemoglobin: 14.7 g/dL (ref 11.7–15.5)
MCH: 30.9 pg (ref 27.0–33.0)
MCHC: 33.7 g/dL (ref 32.0–36.0)
MCV: 91.6 fL (ref 80.0–100.0)
MPV: 9.1 fL (ref 7.5–12.5)
Platelets: 366 10*3/uL (ref 140–400)
RBC: 4.76 10*6/uL (ref 3.80–5.10)
RDW: 12.5 % (ref 11.0–15.0)
WBC: 8.3 10*3/uL (ref 3.8–10.8)

## 2021-05-13 LAB — COMPREHENSIVE METABOLIC PANEL
AG Ratio: 1.8 (calc) (ref 1.0–2.5)
ALT: 24 U/L (ref 6–29)
AST: 21 U/L (ref 10–35)
Albumin: 4.7 g/dL (ref 3.6–5.1)
Alkaline phosphatase (APISO): 88 U/L (ref 37–153)
BUN: 17 mg/dL (ref 7–25)
CO2: 29 mmol/L (ref 20–32)
Calcium: 10.4 mg/dL (ref 8.6–10.4)
Chloride: 103 mmol/L (ref 98–110)
Creat: 0.83 mg/dL (ref 0.50–1.05)
Globulin: 2.6 g/dL (calc) (ref 1.9–3.7)
Glucose, Bld: 89 mg/dL (ref 65–99)
Potassium: 4.4 mmol/L (ref 3.5–5.3)
Sodium: 141 mmol/L (ref 135–146)
Total Bilirubin: 0.6 mg/dL (ref 0.2–1.2)
Total Protein: 7.3 g/dL (ref 6.1–8.1)

## 2021-05-13 LAB — 17-HYDROXYPROGESTERONE: 17-OH-Progesterone, LC/MS/MS: 24 ng/dL

## 2021-06-15 ENCOUNTER — Other Ambulatory Visit: Payer: Self-pay | Admitting: Obstetrics and Gynecology

## 2021-06-15 DIAGNOSIS — Z1231 Encounter for screening mammogram for malignant neoplasm of breast: Secondary | ICD-10-CM

## 2021-06-15 DIAGNOSIS — M8580 Other specified disorders of bone density and structure, unspecified site: Secondary | ICD-10-CM

## 2021-07-01 ENCOUNTER — Other Ambulatory Visit: Payer: Self-pay

## 2021-07-01 ENCOUNTER — Ambulatory Visit
Admission: RE | Admit: 2021-07-01 | Discharge: 2021-07-01 | Disposition: A | Discharge status: Home / Self Care | Payer: PRIVATE HEALTH INSURANCE | Source: Ambulatory Visit | Attending: Obstetrics and Gynecology | Admitting: Obstetrics and Gynecology

## 2021-07-01 DIAGNOSIS — Z1231 Encounter for screening mammogram for malignant neoplasm of breast: Secondary | ICD-10-CM

## 2021-11-11 ENCOUNTER — Ambulatory Visit
Admission: RE | Admit: 2021-11-11 | Discharge: 2021-11-11 | Disposition: A | Discharge status: Home / Self Care | Payer: 59 | Source: Ambulatory Visit | Attending: Obstetrics and Gynecology | Admitting: Obstetrics and Gynecology

## 2021-11-11 DIAGNOSIS — M8580 Other specified disorders of bone density and structure, unspecified site: Secondary | ICD-10-CM

## 2021-11-30 ENCOUNTER — Ambulatory Visit (INDEPENDENT_AMBULATORY_CARE_PROVIDER_SITE_OTHER): Payer: 59 | Admitting: Obstetrics and Gynecology

## 2021-11-30 ENCOUNTER — Encounter: Payer: Self-pay | Admitting: Obstetrics and Gynecology

## 2021-11-30 VITALS — BP 122/72 | HR 88 | Wt 122.0 lb

## 2021-11-30 DIAGNOSIS — M810 Age-related osteoporosis without current pathological fracture: Secondary | ICD-10-CM

## 2021-11-30 DIAGNOSIS — N952 Postmenopausal atrophic vaginitis: Secondary | ICD-10-CM

## 2021-11-30 DIAGNOSIS — N941 Unspecified dyspareunia: Secondary | ICD-10-CM

## 2021-11-30 MED ORDER — ALENDRONATE SODIUM 70 MG PO TABS: 70.0000 mg | ORAL_TABLET | ORAL | 1 refills | Status: DC

## 2021-11-30 NOTE — Progress Notes (Signed)
GYNECOLOGY  VISIT   HPI: 66 y.o.   Married White or Caucasian Not Hispanic or Latino  female   G0P0000 with Patient's last menstrual period was 05/03/2008.   Here to discuss DEXA results from 11/11/21. T score of -2.7 in her forearm, -2.6 in her left femur neck. The lumbar spine was excluded due to degenerative changes.     She gets calcium and vit D. She exercises some. No tobacco, 4-5 drinks a week.  09/02/21 labs reviewed: TSH 1.78 Creatinine 0.99, GFR 64 Calcium 10.1  She was started on vaginal estrogen in 1/23. She was taking it regularly for 3 months, didn't notice a big improvement in dyspareunia, so she has not been using it regularly.  She did use vaginal lubrication.    GYNECOLOGIC HISTORY: Patient's last menstrual period was 05/03/2008. 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   COPD (chronic obstructive pulmonary disease) (HCC) 09/27/2013   Disorder of kidney and ureter 07/18/2013   Polycystic ovaries 07/18/2013    Past Medical History:  Diagnosis Date   Abnormal pap 2005   CIN 1- Colpo CIN1, Neg ECC   Ankle fracture    Anxiety    Arm fracture, left 2019   Colitis    COPD (chronic obstructive pulmonary disease) (HCC) 08/2013   had spirometry    Hirsutism    History of COVID-19 01/30/2021   History of PCOS    Hx of migraines    Osteopenia     Past Surgical History:  Procedure Laterality Date   APPENDECTOMY     CATARACT EXTRACTION,  BILATERAL Bilateral 04/2017   PARTIAL COLECTOMY Right 12/2002   with appendectomy    Current Outpatient Medications  Medication Sig Dispense Refill   B Complex Vitamins (VITAMIN-B COMPLEX) TABS Take by mouth.     Calcium Carb-Cholecalciferol (CALCIUM 1000 + D PO) Take by mouth.     FIBER PO Take by mouth 2 (two) times daily.     FLUoxetine (PROZAC) 10 MG tablet TAKE 1 TABLET BY MOUTH EVERY DAY     hydrocortisone 2.5 % cream Apply topically.     L-Lysine 500 MG TABS Take by mouth.     TURMERIC PO Take by mouth daily.     valACYclovir (VALTREX) 1000 MG tablet TAKE 2 GRAMS AND REPEAT IN 12 HOURS FOR FEVER BLISTERS. (Patient taking differently: as needed. Take 2 grams and repeat in 12 hours for fever blisters.) 30 tablet 1   Estradiol 10 MCG TABS vaginal tablet Place one tablet vaginally qhs x 1 week, then change to 2 x a week (Patient not taking: Reported on 11/30/2021) 24 tablet 4   No current facility-administered medications for this visit.  ALLERGIES: Codeine, Minocin [minocycline hcl], Nsaids, and Ultram [tramadol]  Family History  Problem Relation Age of Onset   Cancer - Ovarian Mother 99   Cancer - Colon Mother 41   Arthritis Mother    Hypertension Father    Arthritis Father        psoriatric arthritis   Psoriasis Father    Diabetes Paternal Grandfather    Heart disease Maternal Grandfather    Rheum arthritis Maternal Grandfather    Colonic polyp Sister        adenosarcoma in the polyp, did not have colon resection   Rheum arthritis Sister    Colon cancer Cousin 76       paternal cousin   Prostate cancer Brother     Social History   Socioeconomic History   Marital status: Married    Spouse name: Not on file   Number of children: Not on file   Years of education: Not on file   Highest education level: Not on file  Occupational History   Not on file  Tobacco Use   Smoking status: Never   Smokeless tobacco: Never  Vaping Use   Vaping Use: Never used   Substance and Sexual Activity   Alcohol use: Yes    Alcohol/week: 4.0 standard drinks of alcohol    Types: 4 Standard drinks or equivalent per week    Comment: occ   Drug use: No   Sexual activity: Not Currently    Partners: Male    Birth control/protection: Post-menopausal  Other Topics Concern   Not on file  Social History Narrative   Not on file   Social Determinants of Health   Financial Resource Strain: Not on file  Food Insecurity: Not on file  Transportation Needs: Not on file  Physical Activity: Not on file  Stress: Not on file  Social Connections: Not on file  Intimate Partner Violence: Not on file    Review of Systems  All other systems reviewed and are negative.   PHYSICAL EXAMINATION:    BP 122/72   Pulse 88   Wt 122 lb (55.3 kg)   LMP 05/03/2008   SpO2 100%   BMI 21.27 kg/m     General appearance: alert, cooperative and appears stated age  30. Age-related osteoporosis without current pathological fracture Recent normal calcium and creatinine. - VITAMIN D 25 Hydroxy (Vit-D Deficiency, Fractures) - CBC - Magnesium - Phosphorus -Discussed getting adequate calcium and vit D, discussed exercise -She should make her house as fall proof as possible -Reviewed the importance of working on balance -Discussed medication for osteoporosis. Reviewed the risks and benefits of treatment. Discussed side effects, including but not limited to: esophageal irritation, muscle aches and pains, the rare side effects of osteonecrosis of the jaw or atypical fractures.  - alendronate (FOSAMAX) 70 MG tablet; Take 1 tablet (70 mg total) by mouth every 7 (seven) days. Take first thing in am with 6 oz. Water.  Be upright after taking.  Eat nothing for one hour.  Dispense: 24 tablet; Refill: 1 -F/U DEXA in 2 years -UTD handout on osteoporosis and treatment was given  2. Vaginal atrophy She has vaginal estrogen, not currently using it regularly  3. Dyspareunia in female She  will restart her vaginal estrogen Use a lubricant Recommended she try vaginal dilators, handout given  Over 30 minutes spent in total patient care.

## 2021-11-30 NOTE — Patient Instructions (Signed)

## 2021-12-01 LAB — CBC
HCT: 42.5 % (ref 35.0–45.0)
Hemoglobin: 14.4 g/dL (ref 11.7–15.5)
MCH: 30.7 pg (ref 27.0–33.0)
MCHC: 33.9 g/dL (ref 32.0–36.0)
MCV: 90.6 fL (ref 80.0–100.0)
MPV: 9.6 fL (ref 7.5–12.5)
Platelets: 352 10*3/uL (ref 140–400)
RBC: 4.69 10*6/uL (ref 3.80–5.10)
RDW: 12.5 % (ref 11.0–15.0)
WBC: 8.6 10*3/uL (ref 3.8–10.8)

## 2021-12-01 LAB — PHOSPHORUS: Phosphorus: 3.9 mg/dL (ref 2.5–4.5)

## 2021-12-01 LAB — MAGNESIUM: Magnesium: 2 mg/dL (ref 1.5–2.5)

## 2021-12-01 LAB — VITAMIN D 25 HYDROXY (VIT D DEFICIENCY, FRACTURES): Vit D, 25-Hydroxy: 51 ng/mL (ref 30–100)

## 2022-02-16 ENCOUNTER — Telehealth: Payer: Self-pay

## 2022-02-16 DIAGNOSIS — M810 Age-related osteoporosis without current pathological fracture: Secondary | ICD-10-CM

## 2022-02-16 NOTE — Telephone Encounter (Signed)
Pt is happy to give the fosamax 10mg  daily a try and appt set for 03/23/22 to f/u w/ Dr. Talbert Nan made. Will call in meantime if unable to tolerate.

## 2022-02-16 NOTE — Telephone Encounter (Signed)
Mary Trujillo pt calling to report that she is not tolerating generic fosamax well. Reports she is experiencing vertigo and nausea for ~2-3 days after taking. Is inquiring if there are any other alternatives besides this med? Possibly if there is something that she can take with a lower dose but more frequently or any other suggestions.   Additional Info: Last AEX 05/08/2021--scheduled for 05/12/2022 Last DEXA: 11/11/2021-osteoporosis w/ T-score of forearm @ -2.7.   Please advise.

## 2022-02-16 NOTE — Telephone Encounter (Signed)
She could consider taking Fosamax 10 mg daily or taking completely different osteoporosis medications which are not taken orally.   She may experience nausea on the lower dosage of Fosamax as well.   If she would like to try the Fosamax 10 mg daily, I recommend she return for an office visit in one month with Dr. Talbert Nan.   If she would like to consider alternatives to the Fosamax, I recommend an office visit with Dr. Talbert Nan.

## 2022-02-17 MED ORDER — ALENDRONATE SODIUM 10 MG PO TABS: 10.0000 mg | ORAL_TABLET | Freq: Every day | ORAL | 0 refills | Status: DC

## 2022-02-17 NOTE — Telephone Encounter (Signed)
Thank you for assisting Ms. Wecker.  You may close this encounter.

## 2022-03-17 ENCOUNTER — Other Ambulatory Visit: Payer: Self-pay | Admitting: Obstetrics and Gynecology

## 2022-03-17 DIAGNOSIS — M810 Age-related osteoporosis without current pathological fracture: Secondary | ICD-10-CM

## 2022-03-17 NOTE — Telephone Encounter (Signed)
Med refill request:Alendronate 10 mg tab PO daily Last AEX: 05/08/2021 -JJ Next AEX: 05/12/22 -JJ  Scheduled for 1 mo med f/u 03/23/22   Last MMG (if hormonal med) N/A Refill authorized: Please Advise?

## 2022-03-23 ENCOUNTER — Ambulatory Visit: Payer: 59 | Admitting: Obstetrics and Gynecology

## 2022-04-20 ENCOUNTER — Encounter: Payer: Self-pay | Admitting: Obstetrics and Gynecology

## 2022-04-20 ENCOUNTER — Ambulatory Visit: Payer: 59 | Admitting: Obstetrics and Gynecology

## 2022-04-20 VITALS — BP 120/72 | Wt 120.0 lb

## 2022-04-20 DIAGNOSIS — M810 Age-related osteoporosis without current pathological fracture: Secondary | ICD-10-CM

## 2022-04-20 NOTE — Progress Notes (Signed)
GYNECOLOGY  VISIT   HPI: 66 y.o.   Married White or Caucasian Not Hispanic or Latino  female   G0P0000 with Patient's last menstrual period was 05/03/2008.   here for f/u on medication. She was started on Fosamax on 11/30/21 secondary to a diagnosis of osteoporosis. She called on 02/16/22 c/o vertigo and nausea for ~2-3 days after taking the Fosamax. She was switched to daily Fosamax and is here for f/u. She is doing fine on the daily medication. When she first started the medication she was having joint pain, but that has resolved.  No other concerns.   GYNECOLOGIC HISTORY: Patient's last menstrual period was 05/03/2008. 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   COPD (chronic obstructive pulmonary disease) (HCC) 09/27/2013   Disorder of kidney and ureter 07/18/2013   Polycystic ovaries 07/18/2013    Past Medical History:  Diagnosis Date   Abnormal pap 2005   CIN 1- Colpo CIN1, Neg ECC   Ankle fracture    Anxiety    Arm fracture, left 2019   Colitis    COPD (chronic obstructive pulmonary disease) (HCC) 08/2013   had spirometry    Hirsutism    History of COVID-19 01/30/2021   History of PCOS    Hx of migraines    Osteopenia     Past Surgical History:  Procedure Laterality Date   APPENDECTOMY     CATARACT EXTRACTION, BILATERAL Bilateral 04/2017   PARTIAL COLECTOMY Right 12/2002   with appendectomy    Current Outpatient Medications  Medication Sig Dispense Refill    alendronate (FOSAMAX) 10 MG tablet TAKE 1 TABLET BY MOUTH DAILY BEFORE BREAKFAST. TAKE WITH A FULL GLASS OF WATER ON AN EMPTY STOMACH. 90 tablet 0   B Complex Vitamins (VITAMIN-B COMPLEX) TABS Take by mouth.     Calcium Carb-Cholecalciferol (CALCIUM 1000 + D PO) Take by mouth.     Estradiol 10 MCG TABS vaginal tablet Place one tablet vaginally qhs x 1 week, then change to 2 x a week (Patient not taking: Reported on 11/30/2021) 24 tablet 4   FIBER PO Take by mouth 2 (two) times daily.     FLUoxetine (PROZAC) 10 MG tablet TAKE 1 TABLET BY MOUTH EVERY DAY     hydrocortisone 2.5 % cream Apply topically.     L-Lysine 500 MG TABS Take by mouth.     TURMERIC PO Take by mouth daily.     valACYclovir (VALTREX) 1000 MG tablet TAKE 2 GRAMS AND REPEAT IN 12 HOURS FOR FEVER BLISTERS. (Patient taking differently: as needed. Take 2 grams and repeat in 12 hours for fever blisters.) 30 tablet 1   No current facility-administered medications for this visit.     ALLERGIES: Codeine, Minocin [  minocycline hcl], Nsaids, and Ultram [tramadol]  Family History  Problem Relation Age of Onset   Cancer - Ovarian Mother 48   Cancer - Colon Mother 52   Arthritis Mother    Hypertension Father    Arthritis Father        psoriatric arthritis   Psoriasis Father    Diabetes Paternal Grandfather    Heart disease Maternal Grandfather    Rheum arthritis Maternal Grandfather    Colonic polyp Sister        adenosarcoma in the polyp, did not have colon resection   Rheum arthritis Sister    Colon cancer Cousin 87       paternal cousin   Prostate cancer Brother     Social History   Socioeconomic History   Marital status: Married    Spouse name: Not on file   Number of children: Not on file   Years of education: Not on file   Highest education level: Not on file  Occupational History   Not on file  Tobacco Use   Smoking status: Never   Smokeless tobacco: Never  Vaping Use   Vaping Use: Never used  Substance and  Sexual Activity   Alcohol use: Yes    Alcohol/week: 4.0 standard drinks of alcohol    Types: 4 Standard drinks or equivalent per week    Comment: occ   Drug use: No   Sexual activity: Not Currently    Partners: Male    Birth control/protection: Post-menopausal  Other Topics Concern   Not on file  Social History Narrative   Not on file   Social Determinants of Health   Financial Resource Strain: Not on file  Food Insecurity: Not on file  Transportation Needs: Not on file  Physical Activity: Not on file  Stress: Not on file  Social Connections: Not on file  Intimate Partner Violence: Not on file    ROS  PHYSICAL EXAMINATION:    LMP 05/03/2008     General appearance: alert, cooperative and appears stated age  83. Age-related osteoporosis without current pathological fracture She is doing well on the daily Fosamax. She will continue the Fosamax and f/u next month.

## 2022-05-05 NOTE — Progress Notes (Signed)
67 y.o. [place] Married White or Caucasian Not Hispanic or Latino female here for annual exam.  No vaginal bleeding. Not sexually active.   No bowel or bladder c/o.    Mother with h/o ovarian cancer at 20 and colon cancer at 56. Brother with prostate cancer that spread to his colon. Her sister had precancerous polyps. She has declined seeing a Dietitian.   She was started on fosamax at the end of 7/23 for osteoporosis. She didn't tolerate it weekly but is doing fine on daily fosamax.  H/O oral HSV, she will reach out when she needs a script.   Patient's last menstrual period was 05/03/2008.          Sexually active: Yes.    The current method of family planning is post menopausal status.    Exercising: Yes.     Walking  Smoker:  no  Health Maintenance: Pap:   11-26-19 normal, negative hpv  History of abnormal Pap:  yes, h/o CIN I 2005 MMG:  07/01/21 Density C Bi-rads 1 neg  BMD:   11/11/21 osteoporotic, T score -2.7, started on fosamax.   Colonoscopy: 2020 f/u 5 years per patient  TDaP:  05/12/17 Gardasil: n/a   reports that she has never smoked. She has never used smokeless tobacco. She reports current alcohol use of about 4.0 standard drinks of alcohol per week. She reports that she does not use drugs. She is a Doctor, general practice.   She has one Psychiatrist and 2 grandsons, in [place].   Past Medical History:  Diagnosis Date   Abnormal pap 2005   CIN 1- Colpo CIN1, Neg ECC   Ankle fracture    Anxiety    Arm fracture, left 2019   Colitis    COPD (chronic obstructive pulmonary disease) ([facility]) 08/2013   had spirometry    Hirsutism    History of COVID-19 01/30/2021   History of PCOS    Hx of migraines    Osteopenia     Past Surgical History:  Procedure Laterality Date   APPENDECTOMY     CATARACT EXTRACTION, BILATERAL Bilateral 04/2017   PARTIAL COLECTOMY Right 12/2002   with appendectomy    Current Outpatient Medications  Medication Sig Dispense Refill   B  Complex Vitamins (VITAMIN-B COMPLEX) TABS Take by mouth.     Calcium Carb-Cholecalciferol (CALCIUM 1000 + D PO) Take by mouth.     FIBER PO Take by mouth 2 (two) times daily.     FLUoxetine (PROZAC) 10 MG tablet TAKE 1 TABLET BY MOUTH EVERY DAY     hydrocortisone 2.5 % cream Apply topically.     L-Lysine 500 MG TABS Take by mouth.     TURMERIC PO Take by mouth daily.     valACYclovir (VALTREX) 1000 MG tablet TAKE 2 GRAMS AND REPEAT IN 12 HOURS FOR FEVER BLISTERS. (Patient taking differently: as needed. Take 2 grams and repeat in 12 hours for fever blisters.) 30 tablet 1   alendronate (FOSAMAX) 10 MG tablet TAKE 1 TABLET BY MOUTH DAILY BEFORE BREAKFAST. TAKE WITH A FULL GLASS OF WATER ON AN EMPTY STOMACH. 90 tablet 3   No current facility-administered medications for this visit.    Family History  Problem Relation Age of Onset   Cancer - Ovarian Mother 37   Cancer - Colon Mother 35   Arthritis Mother    Hypertension Father    Arthritis Father        psoriatric arthritis   Psoriasis Father  Diabetes Paternal Grandfather    Heart disease Maternal Grandfather    Rheum arthritis Maternal Grandfather    Colonic polyp Sister        adenosarcoma in the polyp, did not have colon resection   Rheum arthritis Sister    Colon cancer Cousin 51       paternal cousin   Prostate cancer Brother     Review of Systems  All other systems reviewed and are negative.   Exam:   BP 112/64   Pulse 62   Ht 5\' 4"  (1.626 m)   Wt 123 lb (55.8 kg)   LMP 05/03/2008   SpO2 100%   BMI 21.11 kg/m   Weight change: @WEIGHTCHANGE @ Height:   Height: 5\' 4"  (162.6 cm)  Ht Readings from Last 3 Encounters:  05/12/22 5\' 4"  (1.626 m)  05/08/21 5' 3.5" (1.613 m)  05/28/20 5\' 4"  (1.626 m)    General appearance: alert, cooperative and appears stated age Head: Normocephalic, without obvious abnormality, atraumatic Neck: no adenopathy, supple, symmetrical, trachea midline and thyroid normal to inspection and  palpation Lungs: clear to auscultation bilaterally Cardiovascular: regular rate and rhythm Breasts: normal appearance, no masses or tenderness Abdomen: soft, non-tender; non distended,  no masses,  no organomegaly Extremities: extremities normal, atraumatic, no cyanosis or edema Skin: Skin color, texture, turgor normal. No rashes or lesions Lymph nodes: Cervical, supraclavicular, and axillary nodes normal. No abnormal inguinal nodes palpated Neurologic: Grossly normal   Pelvic: External genitalia:  no lesions              Urethra:  normal appearing urethra with no masses, tenderness or lesions              Bartholins and Skenes: normal                 Vagina: atrophic appearing vagina with normal color and discharge, no lesions              Cervix: no lesions               Bimanual Exam:  Uterus:  normal size, contour, position, consistency, mobility, non-tender              Adnexa: no mass, fullness, tenderness               Rectovaginal: Confirms               Anus:  normal sphincter tone, no lesions  Gae Dry, CMA chaperoned for the exam.  1. Well woman exam Discussed breast self exam Mammogram in 3/24 Labs with primary No pap this year Colonoscopy UTD  2. Herpes simplex type 1 infection She will call when she needs a refill of valtrex  3. Age-related osteoporosis without current pathological fracture Discussed calcium and vit D intake - alendronate (FOSAMAX) 10 MG tablet; TAKE 1 TABLET BY MOUTH DAILY BEFORE BREAKFAST. TAKE WITH A FULL GLASS OF WATER ON AN EMPTY STOMACH.  Dispense: 90 tablet; Refill: 3 -Next DEXA in 7/25  4. Vaginal atrophy No longer sexually active, not using vaginal estrogen  5. Family history of ovarian cancer Declines genetic counseling Reviewed no standard screening, discussed the option of u/s and CA 125 (she declines)  6. Family history of colon cancer Getting q 5 year colonoscopies

## 2022-05-12 ENCOUNTER — Encounter: Payer: Self-pay | Admitting: Obstetrics and Gynecology

## 2022-05-12 ENCOUNTER — Ambulatory Visit (INDEPENDENT_AMBULATORY_CARE_PROVIDER_SITE_OTHER): Payer: 59 | Admitting: Obstetrics and Gynecology

## 2022-05-12 VITALS — BP 112/64 | HR 62 | Ht 64.0 in | Wt 123.0 lb

## 2022-05-12 DIAGNOSIS — N952 Postmenopausal atrophic vaginitis: Secondary | ICD-10-CM

## 2022-05-12 DIAGNOSIS — B009 Herpesviral infection, unspecified: Secondary | ICD-10-CM | POA: Diagnosis not present

## 2022-05-12 DIAGNOSIS — M810 Age-related osteoporosis without current pathological fracture: Secondary | ICD-10-CM

## 2022-05-12 DIAGNOSIS — Z01419 Encounter for gynecological examination (general) (routine) without abnormal findings: Secondary | ICD-10-CM | POA: Diagnosis not present

## 2022-05-12 DIAGNOSIS — Z8041 Family history of malignant neoplasm of ovary: Secondary | ICD-10-CM

## 2022-05-12 DIAGNOSIS — Z800 Family history of malignant neoplasm of digestive organs: Secondary | ICD-10-CM

## 2022-05-12 MED ORDER — ALENDRONATE SODIUM 10 MG PO TABS: ORAL_TABLET | ORAL | 3 refills | Status: DC

## 2022-05-12 NOTE — Patient Instructions (Signed)

## 2023-05-16 ENCOUNTER — Ambulatory Visit: Payer: 59 | Admitting: Obstetrics and Gynecology

## 2023-06-15 ENCOUNTER — Ambulatory Visit: Payer: No Typology Code available for payment source | Admitting: Obstetrics and Gynecology

## 2023-06-15 ENCOUNTER — Other Ambulatory Visit (HOSPITAL_COMMUNITY)
Admission: RE | Admit: 2023-06-15 | Discharge: 2023-06-15 | Disposition: A | Discharge status: Home / Self Care | Payer: No Typology Code available for payment source | Source: Ambulatory Visit | Attending: Obstetrics and Gynecology | Admitting: Obstetrics and Gynecology

## 2023-06-15 ENCOUNTER — Encounter: Payer: Self-pay | Admitting: Obstetrics and Gynecology

## 2023-06-15 VITALS — BP 120/72 | HR 75 | Temp 97.9°F | Ht 63.78 in | Wt 122.0 lb

## 2023-06-15 DIAGNOSIS — Z1331 Encounter for screening for depression: Secondary | ICD-10-CM

## 2023-06-15 DIAGNOSIS — K649 Unspecified hemorrhoids: Secondary | ICD-10-CM

## 2023-06-15 DIAGNOSIS — Z124 Encounter for screening for malignant neoplasm of cervix: Secondary | ICD-10-CM

## 2023-06-15 DIAGNOSIS — B009 Herpesviral infection, unspecified: Secondary | ICD-10-CM | POA: Diagnosis not present

## 2023-06-15 DIAGNOSIS — M810 Age-related osteoporosis without current pathological fracture: Secondary | ICD-10-CM

## 2023-06-15 DIAGNOSIS — Z01419 Encounter for gynecological examination (general) (routine) without abnormal findings: Secondary | ICD-10-CM

## 2023-06-15 MED ORDER — ALENDRONATE SODIUM 10 MG PO TABS: ORAL_TABLET | ORAL | 3 refills | Status: AC

## 2023-06-15 MED ORDER — VALACYCLOVIR HCL 1 G PO TABS: ORAL_TABLET | ORAL | 1 refills | Status: AC

## 2023-06-15 NOTE — Assessment & Plan Note (Signed)
Cervical cancer screening performed according to ASCCP guidelines. Encouraged annual mammogram screening Colonoscopy due this year DXA due July 2025 Labs and immunizations with her primary Encouraged safe sexual practices as indicated Encouraged healthy lifestyle practices with diet and exercise For patients under 50-67yo, I recommend 1200mg  calcium daily and 600IU of vitamin D daily.

## 2023-06-15 NOTE — Progress Notes (Signed)
68 y.o. G0P0000 postmenopausal female with osteoporosis (started on Fosamax in 2023) here for annual exam. Married.  Best boy.  Pt reports a skin tag/flap on anus that she is concerned with being a hemorrhoid.  Has some bleeding after bowel movements otherwise asymptomatic.  Denies constipation.  Prior GYN note by Dr. Oscar La mentions: Mother with h/o ovarian cancer at 48 and colon cancer at 36. Brother with prostate cancer that spread to his colon. Her sister had precancerous polyps. She has declined seeing a Dentist.   Postmenopausal bleeding: none Pelvic discharge or pain: none Breast mass, nipple discharge or skin changes : non Last PAP:     Component Value Date/Time   DIAGPAP  11/26/2019 1219    - Negative for intraepithelial lesion or malignancy (NILM)   DIAGPAP  05/13/2017 0000    -  NEGATIVE FOR INTRAEPITHELIAL LESIONS OR MALIGNANCY   HPVHIGH Negative 11/26/2019 1219   ADEQPAP  11/26/2019 1219    Satisfactory for evaluation. The presence or absence of an   ADEQPAP  11/26/2019 1219    endocervical/transformation zone component cannot be determined because   ADEQPAP of atrophy. 11/26/2019 1219   Last mammogram: 07/01/2021 BI-RADS 1, density C Last colonoscopy: 2025, every 5 years Last DXA: 11/11/2021 osteoporosis, on Fosamax Sexually active: no  Exercising: no Smoker: No  GYN HISTORY: No significant history  OB History  Gravida Para Term Preterm AB Living  0 0 0 0 0 0  SAB IAB Ectopic Multiple Live Births  0 0 0 0     Past Medical History:  Diagnosis Date   Abnormal pap 2005   CIN 1- Colpo CIN1, Neg ECC   Ankle fracture    Anxiety    Arm fracture, left 2019   Colitis    COPD (chronic obstructive pulmonary disease) (HCC) 08/2013   had spirometry    Hirsutism    History of COVID-19 01/30/2021   History of PCOS    Hx of migraines    Osteopenia    Polycystic ovaries 07/18/2013    Past Surgical History:  Procedure Laterality Date    APPENDECTOMY     CATARACT EXTRACTION, BILATERAL Bilateral 04/2017   PARTIAL COLECTOMY Right 12/2002   with appendectomy    Current Outpatient Medications on File Prior to Visit  Medication Sig Dispense Refill   B Complex Vitamins (VITAMIN-B COMPLEX) TABS Take by mouth.     Calcium Carb-Cholecalciferol (CALCIUM 1000 + D PO) Take by mouth.     FIBER PO Take by mouth 2 (two) times daily.     FLUoxetine (PROZAC) 10 MG tablet TAKE 1 TABLET BY MOUTH EVERY DAY     hydrocortisone 2.5 % cream Apply topically.     L-Lysine 500 MG TABS Take by mouth.     polyvinyl alcohol (LIQUIFILM TEARS) 1.4 % ophthalmic solution 1 drop as needed for dry eyes.     TURMERIC PO Take by mouth daily.     No current facility-administered medications on file prior to visit.    Social History   Socioeconomic History   Marital status: Married    Spouse name: Not on file   Number of children: Not on file   Years of education: Not on file   Highest education level: Not on file  Occupational History   Not on file  Tobacco Use   Smoking status: Never   Smokeless tobacco: Never  Vaping Use   Vaping status: Never Used  Substance and Sexual Activity   Alcohol  use: Not Currently   Drug use: No   Sexual activity: Not Currently    Partners: Male    Birth control/protection: Post-menopausal  Other Topics Concern   Not on file  Social History Narrative   Not on file   Social Drivers of Health   Financial Resource Strain: Not on file  Food Insecurity: Low Risk  (01/07/2023)   Received from [facility]   Hunger Vital Sign    Worried About Running Out of Food in the Last Year: Never true    Ran Out of Food in the Last Year: Never true  Transportation Needs: No Transportation Needs (01/07/2023)   Received from [facility]    In the past 12 months, has lack of reliable transportation kept you from medical appointments, meetings, work or from getting things needed for daily living? : No   Physical Activity: Not on file  Stress: Not on file  Social Connections: Not on file  Intimate Partner Violence: Not on file    Family History  Problem Relation Age of Onset   Cancer - Ovarian Mother 65   Cancer - Colon Mother 61   Arthritis Mother    Hypertension Father    Arthritis Father        psoriatric arthritis   Psoriasis Father    Diabetes Paternal Grandfather    Heart disease Maternal Grandfather    Rheum arthritis Maternal Grandfather    Colonic polyp Sister        adenosarcoma in the polyp, did not have colon resection   Rheum arthritis Sister    Colon cancer Cousin 38       paternal cousin   Prostate cancer Brother     Allergies  Allergen Reactions   Codeine Nausea Only   Minocin [Minocycline Hcl] Other (See Comments)    Dizzy; Vertigo   Nsaids Other (See Comments)    Renal insufficiency   Ultram [Tramadol] Other (See Comments)    Violent vomiting      PE Today's Vitals   06/15/23 1502  BP: 120/72  Pulse: 75  Temp: 97.9 F (36.6 C)  TempSrc: Oral  SpO2: 98%  Weight: 122 lb (55.3 kg)  Height: 5' 3.78" (1.62 m)   Body mass index is 21.09 kg/m.  Physical Exam Vitals reviewed. Exam conducted with a chaperone present.  Constitutional:      General: She is not in acute distress.    Appearance: Normal appearance.  HENT:     Head: Normocephalic and atraumatic.     Nose: Nose normal.  Eyes:     Extraocular Movements: Extraocular movements intact.     Conjunctiva/sclera: Conjunctivae normal.  Neck:     Thyroid: No thyroid mass, thyromegaly or thyroid tenderness.  Pulmonary:     Effort: Pulmonary effort is normal.  Chest:     Chest wall: No mass or tenderness.  Breasts:    Right: Normal. No swelling, mass, nipple discharge, skin change or tenderness.     Left: Normal. No swelling, mass, nipple discharge, skin change or tenderness.  Abdominal:     General: There is no distension.     Palpations: Abdomen is soft.     Tenderness: There is  no abdominal tenderness.  Genitourinary:    General: Normal vulva.     Exam position: Lithotomy position.     Urethra: No prolapse.     Vagina: Normal. No vaginal discharge or bleeding.     Cervix: Normal. No lesion.  Uterus: Normal. Not enlarged and not tender.      Adnexa: Right adnexa normal and left adnexa normal.     Rectum: External hemorrhoid present.  Musculoskeletal:        General: Normal range of motion.     Cervical back: Normal range of motion.  Lymphadenopathy:     Upper Body:     Right upper body: No axillary adenopathy.     Left upper body: No axillary adenopathy.     Lower Body: No right inguinal adenopathy. No left inguinal adenopathy.  Skin:    General: Skin is warm and dry.  Neurological:     General: No focal deficit present.     Mental Status: She is alert.  Psychiatric:        Mood and Affect: Mood normal.        Behavior: Behavior normal.      Assessment and Plan:        Well woman exam with routine gynecological exam Assessment & Plan: Cervical cancer screening performed according to ASCCP guidelines. Encouraged annual mammogram screening Colonoscopy due this year DXA due July 2025 Labs and immunizations with her primary Encouraged safe sexual practices as indicated Encouraged healthy lifestyle practices with diet and exercise For patients under 50-70yo, I recommend 1200mg  calcium daily and 600IU of vitamin D daily.    Cervical cancer screening -     Cytology - PAP  Age-related osteoporosis without current pathological fracture Assessment & Plan: Osteoporosis diagnosed in 2023, started on Fosamax at that time Continue vitamin D+Calcium Encouraged weight based exercise DXA due July 2025, ordered   Orders: -     Alendronate Sodium; TAKE 1 TABLET BY MOUTH DAILY BEFORE BREAKFAST. TAKE WITH A FULL GLASS OF WATER ON AN EMPTY STOMACH.  Dispense: 90 tablet; Refill: 3 -     DG Bone Density; Future  Herpes simplex type 1 infection -      valACYclovir HCl; Take 2 grams and repeat in 12 hours for fever blisters.  Dispense: 30 tablet; Refill: 1  Hemorrhoids, unspecified hemorrhoid type  Recommend referral to GI.  Patient will call with GI office that she would like to see in [place], which is closer to home.  Rosalyn Gess, MD

## 2023-06-15 NOTE — Patient Instructions (Signed)

## 2023-06-15 NOTE — Assessment & Plan Note (Signed)
Osteoporosis diagnosed in 2023, started on Fosamax at that time Continue vitamin D+Calcium Encouraged weight based exercise DXA due July 2025, ordered

## 2023-06-17 LAB — CYTOLOGY - PAP
Comment: NEGATIVE
Diagnosis: NEGATIVE
High risk HPV: NEGATIVE

## 2023-06-20 ENCOUNTER — Encounter: Payer: Self-pay | Admitting: Obstetrics and Gynecology
